# Patient Record
Sex: Female | Born: 1935 | Race: White | Hispanic: No | Marital: Married | Smoking: Never smoker
Health system: Southern US, Community
[De-identification: ages and names within clinical notes are randomized; demographics above are authoritative.]

## PROBLEM LIST (undated history)

## (undated) DIAGNOSIS — C7931 Secondary malignant neoplasm of brain: Secondary | ICD-10-CM

## (undated) DIAGNOSIS — I209 Angina pectoris, unspecified: Secondary | ICD-10-CM

## (undated) DIAGNOSIS — G8929 Other chronic pain: Secondary | ICD-10-CM

## (undated) DIAGNOSIS — R002 Palpitations: Secondary | ICD-10-CM

## (undated) DIAGNOSIS — I259 Chronic ischemic heart disease, unspecified: Secondary | ICD-10-CM

## (undated) DIAGNOSIS — I10 Essential (primary) hypertension: Secondary | ICD-10-CM

## (undated) DIAGNOSIS — M549 Dorsalgia, unspecified: Secondary | ICD-10-CM

## (undated) HISTORY — PX: TOTAL KNEE ARTHROPLASTY: SHX125

## (undated) HISTORY — DX: Other chronic pain: G89.29

## (undated) HISTORY — PX: TONSILLECTOMY AND ADENOIDECTOMY: SHX28

## (undated) HISTORY — DX: Dorsalgia, unspecified: M54.9

## (undated) HISTORY — DX: Palpitations: R00.2

---

## 1969-06-28 HISTORY — PX: PARTIAL HYSTERECTOMY: SHX80

## 1998-04-16 ENCOUNTER — Ambulatory Visit (HOSPITAL_BASED_OUTPATIENT_CLINIC_OR_DEPARTMENT_OTHER): Admission: RE | Admit: 1998-04-16 | Discharge: 1998-04-16 | Payer: Self-pay | Admitting: *Deleted

## 1999-10-06 ENCOUNTER — Encounter: Admission: RE | Admit: 1999-10-06 | Discharge: 1999-10-06 | Payer: Self-pay | Admitting: Family Medicine

## 1999-10-06 ENCOUNTER — Encounter: Payer: Self-pay | Admitting: Family Medicine

## 1999-10-14 ENCOUNTER — Encounter: Payer: Self-pay | Admitting: Family Medicine

## 1999-10-14 ENCOUNTER — Encounter: Admission: RE | Admit: 1999-10-14 | Discharge: 1999-10-14 | Payer: Self-pay | Admitting: Family Medicine

## 2000-10-14 ENCOUNTER — Encounter: Payer: Self-pay | Admitting: Family Medicine

## 2000-10-14 ENCOUNTER — Encounter: Admission: RE | Admit: 2000-10-14 | Discharge: 2000-10-14 | Payer: Self-pay | Admitting: Family Medicine

## 2001-10-18 ENCOUNTER — Encounter: Payer: Self-pay | Admitting: Family Medicine

## 2001-10-18 ENCOUNTER — Encounter: Admission: RE | Admit: 2001-10-18 | Discharge: 2001-10-18 | Payer: Self-pay | Admitting: Family Medicine

## 2002-04-10 ENCOUNTER — Encounter: Admission: RE | Admit: 2002-04-10 | Discharge: 2002-04-10 | Payer: Self-pay | Admitting: Family Medicine

## 2002-04-10 ENCOUNTER — Encounter: Payer: Self-pay | Admitting: Family Medicine

## 2002-06-14 ENCOUNTER — Encounter (INDEPENDENT_AMBULATORY_CARE_PROVIDER_SITE_OTHER): Payer: Self-pay | Admitting: *Deleted

## 2002-06-14 ENCOUNTER — Ambulatory Visit (HOSPITAL_COMMUNITY): Admission: RE | Admit: 2002-06-14 | Discharge: 2002-06-14 | Payer: Self-pay | Admitting: Gastroenterology

## 2002-06-28 HISTORY — PX: CORONARY ANGIOPLASTY WITH STENT PLACEMENT: SHX49

## 2002-09-26 ENCOUNTER — Ambulatory Visit (HOSPITAL_COMMUNITY): Admission: RE | Admit: 2002-09-26 | Discharge: 2002-09-27 | Payer: Self-pay | Admitting: Cardiovascular Disease

## 2003-01-18 ENCOUNTER — Encounter: Payer: Self-pay | Admitting: Family Medicine

## 2003-01-18 ENCOUNTER — Encounter: Admission: RE | Admit: 2003-01-18 | Discharge: 2003-01-18 | Payer: Self-pay | Admitting: Family Medicine

## 2003-05-08 ENCOUNTER — Ambulatory Visit (HOSPITAL_COMMUNITY): Admission: RE | Admit: 2003-05-08 | Discharge: 2003-05-08 | Payer: Self-pay | Admitting: Gastroenterology

## 2004-02-04 ENCOUNTER — Encounter: Admission: RE | Admit: 2004-02-04 | Discharge: 2004-02-04 | Payer: Self-pay | Admitting: Family Medicine

## 2004-12-24 ENCOUNTER — Ambulatory Visit (HOSPITAL_COMMUNITY): Admission: RE | Admit: 2004-12-24 | Discharge: 2004-12-24 | Payer: Self-pay | Admitting: Urology

## 2005-02-08 ENCOUNTER — Encounter: Admission: RE | Admit: 2005-02-08 | Discharge: 2005-02-08 | Payer: Self-pay | Admitting: Cardiology

## 2005-06-22 ENCOUNTER — Encounter: Admission: RE | Admit: 2005-06-22 | Discharge: 2005-06-22 | Payer: Self-pay | Admitting: Orthopaedic Surgery

## 2005-07-06 ENCOUNTER — Encounter: Admission: RE | Admit: 2005-07-06 | Discharge: 2005-07-06 | Payer: Self-pay | Admitting: Orthopaedic Surgery

## 2005-07-20 ENCOUNTER — Encounter: Admission: RE | Admit: 2005-07-20 | Discharge: 2005-07-20 | Payer: Self-pay | Admitting: Orthopaedic Surgery

## 2006-02-22 ENCOUNTER — Encounter: Admission: RE | Admit: 2006-02-22 | Discharge: 2006-02-22 | Payer: Self-pay | Admitting: Family Medicine

## 2006-03-28 ENCOUNTER — Inpatient Hospital Stay (HOSPITAL_COMMUNITY): Admission: RE | Admit: 2006-03-28 | Discharge: 2006-03-30 | Payer: Self-pay | Admitting: Orthopaedic Surgery

## 2006-03-28 HISTORY — PX: ANTERIOR CERVICAL DISCECTOMY: SHX1160

## 2006-04-01 ENCOUNTER — Inpatient Hospital Stay (HOSPITAL_COMMUNITY): Admission: EM | Admit: 2006-04-01 | Discharge: 2006-04-06 | Payer: Self-pay | Admitting: Emergency Medicine

## 2007-01-13 ENCOUNTER — Inpatient Hospital Stay (HOSPITAL_COMMUNITY): Admission: RE | Admit: 2007-01-13 | Discharge: 2007-01-17 | Payer: Self-pay | Admitting: Orthopaedic Surgery

## 2007-03-31 ENCOUNTER — Encounter: Admission: RE | Admit: 2007-03-31 | Discharge: 2007-03-31 | Payer: Self-pay | Admitting: Cardiology

## 2007-06-03 IMAGING — CR DG CHEST 2V
2 series · 2 of 2 positions shown · non-contrast
Comparison: None.

CLINICAL DATA: Pre-op respiratory exam for presumed bladder surgery.  Stress urinary continence.  
 PA AND LATERAL CHEST:

[view not recorded (1 of 2)]
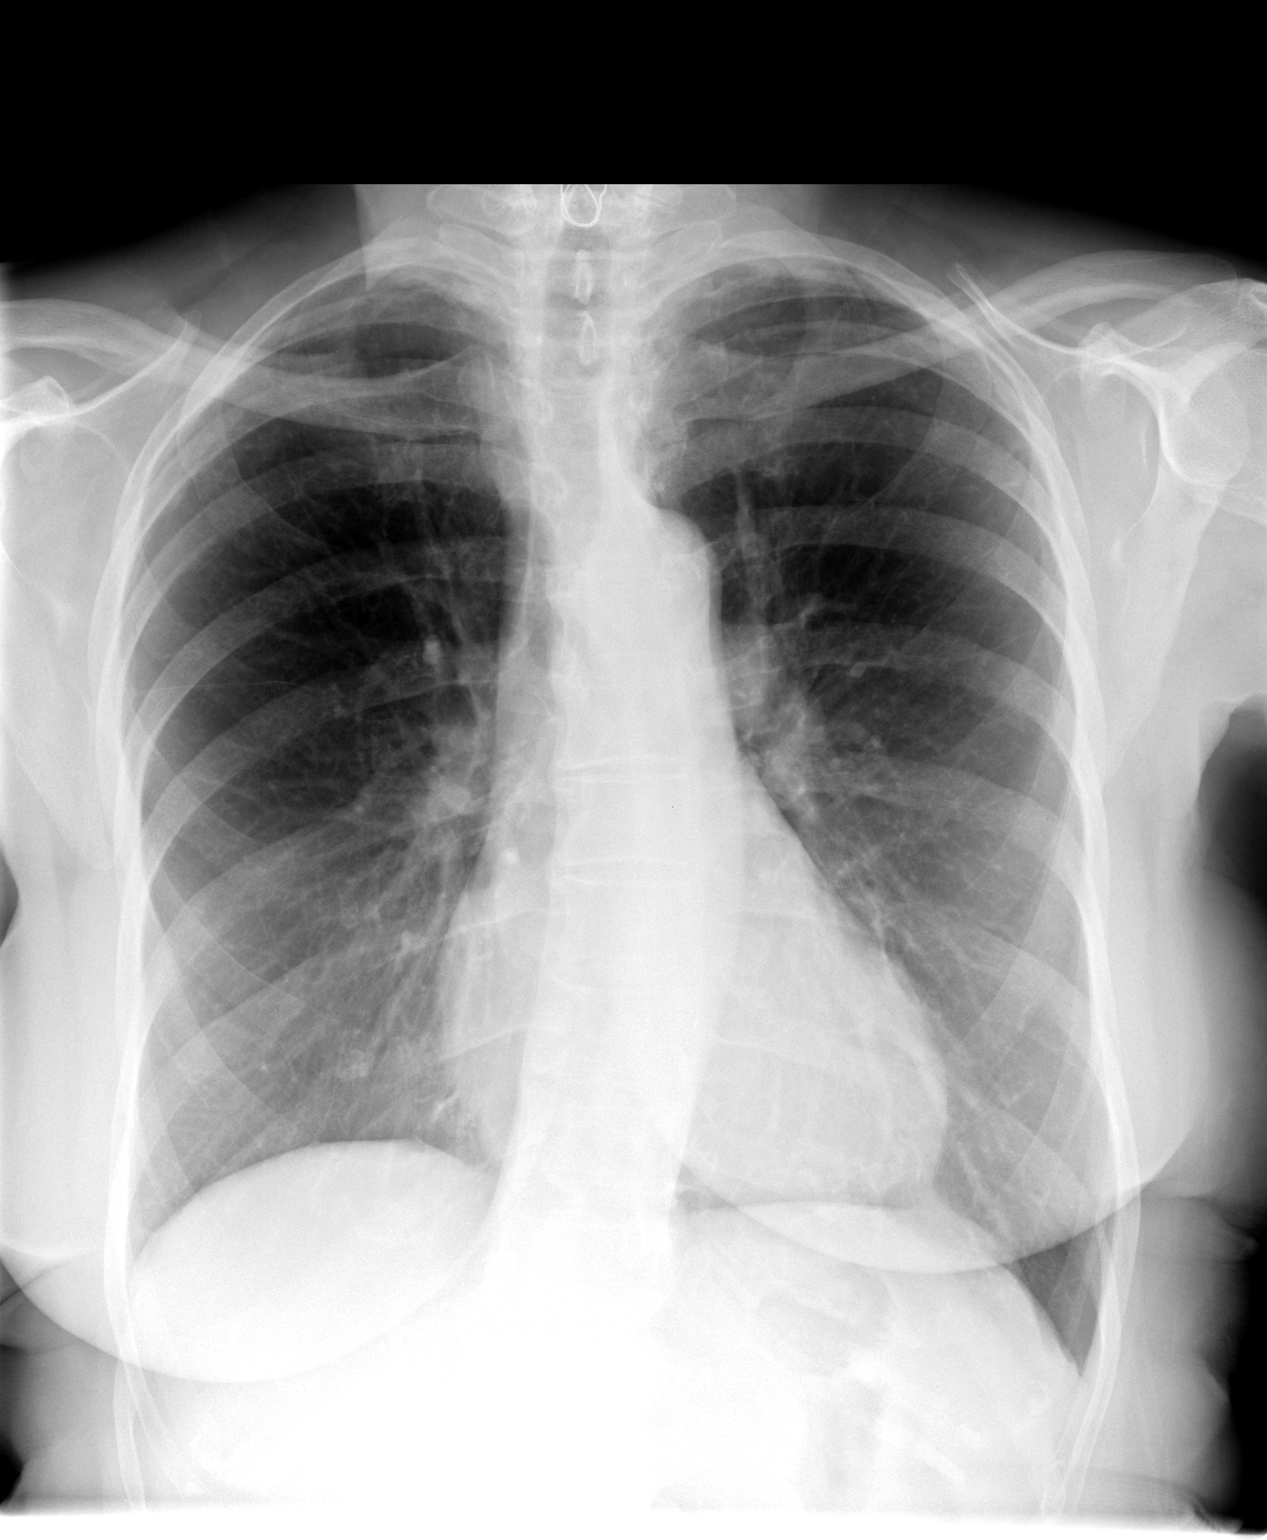

[view not recorded (2 of 2)]
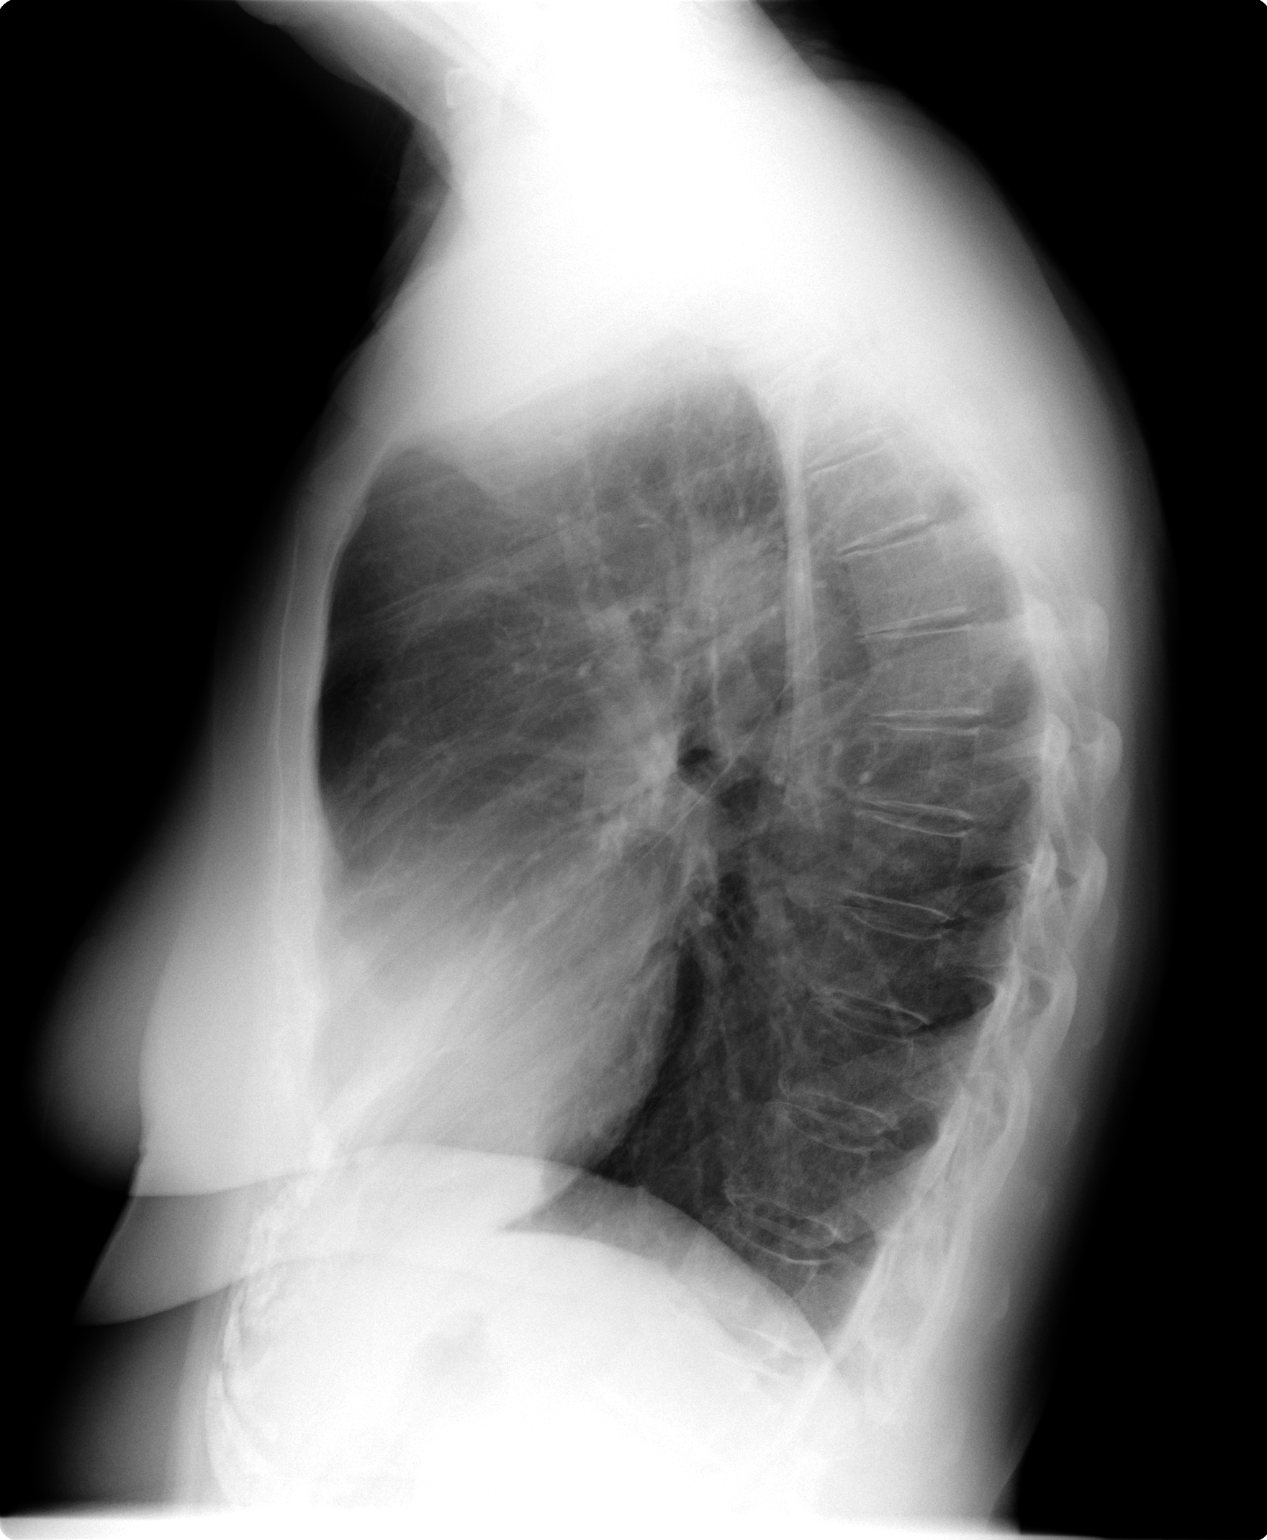

[2 of 2 positions shown; findings below may reference images not displayed]

There is a mild thoracolumbar scoliosis.  The cardiomediastinal contours are normal.  The lungs are hyperinflated but clear.  The patient is status-post lower cervical fusion.
IMPRESSION: Mild pulmonary hyperinflation.  No acute chest findings.

## 2007-09-13 HISTORY — PX: CARDIOVASCULAR STRESS TEST: SHX262

## 2007-09-22 ENCOUNTER — Inpatient Hospital Stay (HOSPITAL_COMMUNITY): Admission: RE | Admit: 2007-09-22 | Discharge: 2007-09-25 | Payer: Self-pay | Admitting: Orthopaedic Surgery

## 2007-09-22 ENCOUNTER — Encounter (INDEPENDENT_AMBULATORY_CARE_PROVIDER_SITE_OTHER): Payer: Self-pay | Admitting: Orthopaedic Surgery

## 2008-04-30 ENCOUNTER — Encounter: Admission: RE | Admit: 2008-04-30 | Discharge: 2008-04-30 | Payer: Self-pay | Admitting: Cardiology

## 2008-10-28 ENCOUNTER — Ambulatory Visit (HOSPITAL_COMMUNITY): Admission: RE | Admit: 2008-10-28 | Discharge: 2008-10-29 | Payer: Self-pay | Admitting: Orthopaedic Surgery

## 2010-02-24 ENCOUNTER — Ambulatory Visit: Payer: Self-pay | Admitting: Cardiology

## 2010-07-17 ENCOUNTER — Ambulatory Visit: Payer: Self-pay | Admitting: Cardiology

## 2010-10-07 LAB — BASIC METABOLIC PANEL
BUN: 12 mg/dL (ref 6–23)
CO2: 29 mEq/L (ref 19–32)
Calcium: 10 mg/dL (ref 8.4–10.5)
Creatinine, Ser: 0.7 mg/dL (ref 0.4–1.2)
GFR calc Af Amer: >60 mL/min (ref >60)
Glucose, Bld: 97 mg/dL (ref 70–99)

## 2010-10-07 LAB — CBC
MCHC: 34.4 g/dL (ref 30.0–36.0)
Platelets: 170 10*3/uL (ref 150–400)
RDW: 14.9 % (ref 11.5–15.5)

## 2010-11-10 NOTE — Op Note (Signed)
Susan Clark, Susan Clark NO.:  [id number]   MEDICAL RECORD NO.:  [id number]          PATIENT TYPE:  INP   LOCATION:  2899                         FACILITY:  [facility]   PHYSICIAN:  Mark C. Ophelia Charter, M.D.    DATE OF BIRTH:  June 12, 1929   DATE OF PROCEDURE:  10/28/2008  DATE OF DISCHARGE:                               OPERATIVE REPORT   PREOPERATIVE DIAGNOSIS:  L4-5 stenosis with degenerative  anterolisthesis.   POSTOPERATIVE DIAGNOSIS:  L4-5 stenosis with degenerative  anterolisthesis.   PROCEDURE:  L4-5 decompression.   SURGEON:  Mark C. Ophelia Charter, MD   ASSISTANT:  Maud Deed, PA-C   ANESTHESIA:  GOT.   ESTIMATED BLOOD LOSS:  200 mL.   DRAINS:  None.   This 77 year old female has had progressive increased L4-5 stenosis over  the years with neurogenic claudication symptoms, broad-based disk  protrusion at 4-5 and shifting on lateral flexion/extension x-rays.  She  does have some retrolisthesis at upper lumbar levels and having  neurogenic claudication symptoms from her severe stenosis at 4-5.   After induction of general anesthesia and orotracheal intubation, the  patient placed prone with preoperative Ancef prophylaxis, standard prep,  and DuraPrep.  Area was squared with towels, Betadine Vi-Drape  application and laminectomy sheets and drapes.  Needle localization was  placed and needle was just above the L4-5 level at the level of the L4  pedicle.  Incision was started, the needle extended caudad and Weitlaner  retractor was placed.  Spinous process was identified and ligament was  peeled off right and left up.  Trocar was used for termination, but  there was motion at 5-1 and also at 4-5 was expected with narrow space  between the L4 and L5 lamina.  Spinous process was removed from L4 and  top half of L5 and lamina was thinned with rongeur as well as the bur.  Thick chunks of ligament were removed and another x-ray was taken prior  to removing spinous process  with Kochers above and below.  The level of  disk was a planned level of decompression.  After this, I performed the  spinous process __________ then with a bur thick chunks of ligament were  removed.  Patty was used to protect the dura.  At the top portion of the  L5 lamina and in the right gutter, there was adherence of the dura with  scar tissue.  The operative microscope was being used and  microdissection techniques were used to try to release and peel this  off.  It was adherent and a 1-mm dural tear occurred, which was dorsal  and right side.  Small patty was placed and a 6-mm Prolene was used in  the tip of the needle and the hole was about the same size.  A single 6-  0 Prolene suture was placed, tied, and then few drops of Tisseel over  the top.  The leak was dry and continued dissection moving chunks of the  ligament, right and left gutters.  Once the dura was free, another x-ray  was taken with a Penfield 4 down  at the base and this was just above the  level of the disk.  There was overhanging spurs at the level and bone  was removed inferiorly removing bone from the foramina to free up the  nerve root both right and left.  X-ray was taken with a Penfield 4 just  below the level of the disk at the top of the pedicle and confirmed that  we have been all the way down to that level 4 decompression.  After  irrigation with saline solution, Durapore was checked.  It was  watertight.  Wound was irrigated.  0 Vicryl was placed in the fascia,  interrupted sutures, 2-0 Vicryl subcutaneous tissue subcuticular skin  closure.  Tincture of Benzoin, Steri-Strips, postop dressing and then  transferred to recovery room in stable condition.  Instrument count and  needle count was correct.  Hockey stick was placed from the right and  left side at the level of this and swept anterior to the dura, and  although there were large number of epidural veins, Gelfoam was then  placed in each gutter, and  there were no fragments anterior to the dura.  Gelfoam was removed prior to closure.  The patient tolerated the  procedure well and was transferred to recovery room in stable condition.      Mark C. Ophelia Charter, M.D.  Electronically Signed     MCY/MEDQ  D:  10/28/2008  T:  10/29/2008  Job:  [id number]

## 2010-11-10 NOTE — Op Note (Signed)
NAME:  Susan Clark, Susan Clark NO.:  [id number]   MEDICAL RECORD NO.:  [id number]          PATIENT TYPE:  INP   LOCATION:  2550                         FACILITY:  [facility]   PHYSICIAN:  Mark C. Ophelia Charter, M.D.    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   PROCEDURE:  Right knee revision with exchange of meniscal bearings and  patellar polyethylene.   SURGEON:  Annell Greening, M.D.   ANESTHESIA:  GOT plus preoperative femoral block.   PROCEDURE:  After induction of general anesthesia and orotracheal  intubation, standard prepping and draping, sterile skin marker on the  old incision and Betadine Vicryl-Drape application, time-out procedure  check list was completed.  Leg was wrapped in Esmarch, tourniquet  inflated.  Old incision was opened.  Superficial retinaculum was peeled  back, and the old medial parapatellar incision was visualized with line  of nonabsorbable sutures.  A 15 scalpel blade was used to divide through  the old arthrotomy dividing the sutures.  This was medial one-third,  lateral two-thirds quad tendon incision.  There was some fluid present  and synovitis present, mostly adjacent to the patella with brownish  synovium.  The edge of the patellar component showed significant wear,  and flap piece was pushed up.  It was firmly attached to the patella.  This was a snap-on two-piece patella.  Bearing wear was mild to moderate  after 14 years, and soft tissue was debrided until both meniscal  bearings were fully visualized.  Patella was flipped over, knee was  flexed.  Narrow Rogelia Rohrer was placed posteriorly next to the PCL, and  lateral bearing was pulled slightly forward.  Additional synovium was  debrided, and the knee was brought out in extension, and the lateral  bearing was popped out with a right-angle clamp, pulling out of the  runner.  A trial pin was placed, and then effort was worked on the  medial bearing which was more difficult to remove, since the patient was  slightly tighter medial than lateral.  With the knee in flexion and a  McHale retractor placed, meniscal bearing was popped out.  A 12.5 trial  was attempted, but it was too tight, and a 10 trial fit nicely, and a  permanent new polyethylene pin was placed for the medial bearing, and  then the lateral trial was removed, and a 10-mm lateral bearing was  placed.  There was full extension, full flexion, bearings tracked well.  Pulsatile lavage was used, repeat irrigation, and then tourniquet  deflation and hemostasis obtained and then layered closure, standard,  with nonabsorbable and 2-0 in the subcutaneous tissue.  The patient had  very thin skin.  Skin staple closure was used in the skin.  Postoperative dressing, knee immobilizer, and transferred to the  recovery room in stable condition.  Instrument count and needle count  were  correct.      Mark C. Ophelia Charter, M.D.  Electronically Signed    MCY/MEDQ  D:  09/22/2007  T:  09/23/2007  Job:  [id number]

## 2010-11-10 NOTE — Op Note (Signed)
NAME:  Susan Clark, Susan Clark              ACCOUNT NO.:  [id number]   MEDICAL RECORD NO.:  [id number]          PATIENT TYPE:  INP   LOCATION:  2550                         FACILITY:  [facility]   PHYSICIAN:  Mark C. Ophelia Charter, M.D.    DATE OF BIRTH:  09-Nov-1928   DATE OF PROCEDURE:  01/13/2007  DATE OF DISCHARGE:                               OPERATIVE REPORT   PREOPERATIVE DIAGNOSIS:  Left knee osteoarthritis with valgus deformity.   POSTOPERATIVE DIAGNOSIS:  Left knee osteoarthritis with valgus  deformity.   PROCEDURE:  Left cemented total knee arthroplasty computer assisted.   SURGEON:  Mark C. Ophelia Charter, M.D.   ANESTHESIA:  General plus 30 mL local.   ASSISTANT:  Patrick Jupiter, P.A.-C.   TOURNIQUET TIME:  63 minutes.   COMPONENTS USED:  DePuy PFC Sigma cruciate substituting 2.5 size femoral  component with lugs, 2.5 tibial tray, cemented, all poly patella, and 10  mm 2.5 insert, all components cemented, Smart set, HV 40 gram cement.   PROCEDURE IN DETAIL:  After induction of general anesthesia and  orotracheal intubation, preoperative femoral nerve block, preoperative  Ancef prophylaxis, standard prep and drape, proximal thigh tourniquet,  heel bump, and lateral post was used.  Standard total knee sheets,  drapes, impervious stockinette, Coban split sheets, drapes, sterile skin  marker, Betadine Vidrape was used to seal the skin.  The leg was wrapped  in an Esmarch and the tourniquet inflated.   A midline incision was made, superficial retinaculum was divided, medial  parapatellar incision was made splitting the quad tendon between the  medial 1/3 and the lateral 2/3, patella flipped over.  Spurs were  removed and menisci were resected.  There was lateral compartment bone-  on-bone changes with valgus deformity and a 10 degree knee flexion  fracture.  Computer pins were placed bicortical.  Once in the tibia  through a stab incision with 15 blade later closed at the end of the  case with  tincture of Benzoin and Steri-Strips.  Initiation of the  computer was performed, femoral and tibial models were made.  10.2 mm  taken off the femur and cuts were verified.  The chamfer cuts and box  cuts were made.  9 mm resected off the tibia and more bone was removed  medial than lateral, correcting the valgus deformity due to excessive  lateral compartment wear.  A little bit of bone was still more prominent  posteriorly and a little bit of bone was removed posteriorly on the  tibia which corrected the slope back to neutral.  Flexion/extension gaps  were good. Posterior spurs were removed off the femur 3/4 straight  osteotome.  Varus/valgus balancing was less than 0.5 mm.  This was  confirmed in both flexion and extension.  Trials were placed, inserted,  excellent full extension, good stability, flexion/extension.  After  pulsatile lavage, the patella was prepared with the 35 mm all poly  removing 0.5 mm of bone.  The tibia was cemented first followed by  femur, placement of the permanent tray and then patella clamped.  All  excess cement was removed and after  14 minutes, the cement was hard.  The tourniquet was deflated and hemostasis obtained with Bovie  electrocautery.  Sequential closure of the layers with nonabsorbable #1  Ethibond in the quad tendon and true retinaculum, closing the deep layer  anatomically.  Superficial retinaculum closed with 2-0, 2-0 on the subcutaneous tissue,  4-0 Vicryl subcuticular.  Tincture of Benzoin and Steri-Strips. Marcaine  infiltration, some in the joint and some in the skin.  Postop dressing,  knee immobilizer.  Instrument count and needle counts were correct.      Mark C. Ophelia Charter, M.D.  Electronically Signed     MCY/MEDQ  D:  01/13/2007  T:  01/13/2007  Job:  [id number]

## 2010-11-13 NOTE — Op Note (Signed)
   NAME:  Susan Clark, Susan Clark                        ACCOUNT NO.:  [id number]   MEDICAL RECORD NO.:  [id number]                   PATIENT TYPE:  AMB   LOCATION:  ENDO                                 FACILITY:  [facility]   PHYSICIAN:  Anselmo Rod, M.D.               DATE OF BIRTH:  September 12, 1928   DATE OF PROCEDURE:  05/08/2003  DATE OF DISCHARGE:                                 OPERATIVE REPORT   PROCEDURE:  Esophagogastroduodenoscopy.   ENDOSCOPIST:  Anselmo Rod, M.D.   INSTRUMENT USED:  Olympus video panendoscope.   INDICATIONS FOR PROCEDURE:  Abnormal weight loss, iron deficiency anemia in  a 77 year old white female. Rule out peptic ulcer disease, esophagitis,  gastritis, etc.   PREPROCEDURE PREPARATION:  Informed consent was procured from the patient.  The patient fasted for eight hours prior to the procedure.   PREPROCEDURE PHYSICAL:  The patient had stable vital signs. Neck supple.  Chest clear to auscultation. Abdomen soft with normal bowel sounds.   DESCRIPTION OF PROCEDURE:  The patient was placed in the left lateral  decubitus position and sedated with 50 mg of Demerol and 5 mg of Versed  intravenously. Once the patient was adequately sedated and maintained on low  flow oxygen and continuous cardiac monitoring, the Olympus video  panendoscope was advanced with the mouthpiece over the tongue into the  esophagus under direct vision. The entire esophagus appeared normal with no  evidence of ring, stricture, masses, esophagitis or Barrett's mucosa. The  scope was then advanced to the stomach. A small hiatal hernia was seen in  high retroflexion, the rest of the gastric mucosa in the proximal small  bowel appeared normal.   IMPRESSION:  Normal esophagogastroduodenoscopy except for a small hiatal  hernia.   RECOMMENDATIONS:  Proceed with a colonoscopy at a time. Further  recommendations made thereafter.                                               Anselmo Rod,  M.D.    JNM/MEDQ  D:  05/08/2003  T:  05/08/2003  Job:  [id number]   cc:   Marjory Lies, M.D.  [address]  [place]  [place] [address]  Fax: [phone]

## 2010-11-13 NOTE — Op Note (Signed)
NAME:  AYLEN, STRADFORD                        ACCOUNT NO.:  [id number]   MEDICAL RECORD NO.:  [id number]                   PATIENT TYPE:  AMB   LOCATION:  ENDO                                 FACILITY:  [facility]   PHYSICIAN:  Anselmo Rod, M.D.               DATE OF BIRTH:  Sep 11, 1928   DATE OF PROCEDURE:  06/14/2002  DATE OF DISCHARGE:  06/14/2002                                 OPERATIVE REPORT   PROCEDURE:  Colonoscopy with snare polypectomy x1.   ENDOSCOPIST:  Anselmo Rod, M.D.   INSTRUMENT USED:  Olympus video colonoscope.   INDICATIONS FOR PROCEDURE:  A 77 year old white female with a history of  rectal bleeding.  Rule out colonic polyps, masses, etc.  An informed consent was procured from the patient.  The patient was fasted  for eight hours prior to the procedure and prepped with a bottle of  magnesium citrate and NuLytely on the night prior to the procedure.   PRE-PROCEDURE PHYSICAL EXAMINATION:  VITAL SIGNS:  The patient with stable  vital signs.  NECK:  Supple.  CHEST:  Clear to auscultation.  ABDOMEN:  Soft with normal bowel sounds.   DESCRIPTION OF PROCEDURE:  The patient was placed in the left lateral  decubitus position and sedated with 50mg  of Demerol and 5 mg of Versed  intravenously.  Once the patient was adequately sedated and maintained on  low-flow oxygen and continuous cardiac monitoring, the Olympus video  colonoscope was advanced from the rectum to the cecum with difficulty.  There was a large amount of solid stool in the right colon, including the  cecum.  The appendicular orifice and the ileocecal valve were visualized.  A  5.0 to 6.0 mm sessile polyp was snared at 25.0 cm.  Prominent internal  hemorrhoids were seen on retroflexion.  The rest of the examination was  within normal limits.  Visualization of the right colon was somewhat  limited, secondary to a poor prep.   IMPRESSION:  1. Prominent internal hemorrhoids.  2. A 5.0 to 6.0 mm  sessile poly snared from 25.0 cm.  3. Normal-appearing colon.  4. Limited examination of the right colon, including the cecum.   RECOMMENDATIONS:  1. Await pathology results.  2.     A high fiber diet.  3. Avoid all nonsteroidals including aspirin for now.  4. Outpatient followup within the next two weeks with further     recommendations.                                                 Anselmo Rod, M.D.    JNM/MEDQ  D:  06/15/2002  T:  06/17/2002  Job:  [id number]   cc:   Marjory Lies, M.D.  [address]  [place]  [place] [address]  Fax: [phone]

## 2010-11-13 NOTE — Op Note (Signed)
NAME:  Susan Clark, SAND NO.:  [id number]   MEDICAL RECORD NO.:  [id number]          PATIENT TYPE:  INP   LOCATION:  5024                         FACILITY:  [facility]   PHYSICIAN:  Mark C. Ophelia Charter, M.D.    DATE OF BIRTH:  [birth year]   DATE OF PROCEDURE:  03/28/2006  DATE OF DISCHARGE:                                 OPERATIVE REPORT   PREOPERATIVE DIAGNOSIS:  C4-5 spondylosis with herniated nucleus pulposus.   POSTOPERATIVE DIAGNOSIS:  C4-5 spondylosis with herniated nucleus pulposus.   PROCEDURE:  C4-5 anterior cervical discectomy and fusion, left iliac bone  graft.   SURGEON:  Annell Greening, MD   ASSISTANT:  Velna Hatchet Patient, PA-C   ANESTHESIA:  GOT plus Marcaine skin local.   DRAINS:  One Hemovac, neck.   PROCEDURE:  After standard prep with drape and preoperative antibiotics,  time out procedure, the neck and iliac crest was prepped, squared with  towels, sterile skin marker on the neck, Betadine bio-drape and application.  Sterile Mayo stand at the head, thyroid sheets and drapes.  Incision was  made starting at the midline and extending to the left.  Blunt dissection  below the platysma, which had been split in line with the skin incision.  Long-handled Cloward retractors were placed right and left.  Incision was  made in the annulus of the  disc after cross-table lateral x-ray confirmed  that the needle was at C5-6, the level above was 4-5, the operative level  and this was marked by incising the anterior annulus of the disc and  removing some of the disc, leaving a hole for appropriate level.  Self-  retaining Cloward retractors were placed, teeth placed right and left,  underneath the longus Colles, smooth blades up and down to protect the  esophagus.  Discectomy was performed.  Uncovertebral joints were stripped  with Cloward curettes sequentially.  Karlin curettes were used as well.  The  operative microscope was draped and brought in and after drilling  the  Cloward 12 mm hole back to the posterior cortex, the operative microscope  was used to take down the posterior longitudinal ligament.  There was over  hanging spurs with the absence of a posterior disk space from the overhang.  The overhang from the level above was removed with microdissection, 1 mm  Kerrison's were used to remove the spurs off the uncovertebral joint,  enlarging the opening for the foramina.  Posterior longitudinal ligament was  taken down and some remaining epidural veins were left.  There was complete  exposure of the dura and the dura was decompressed.  A small ledge of bone  was left on the inferior aspect of the hole to prevent posterior plug  migration.  A 14 mm plug was harvested from the left iliac crest, splitting  the fascia in line with the fibers to minimize postoperative pain.  Subperiosteal dissection with a Cobb was performed.  Plug was harvested and  impacted, counter sunk 2 mm based on depth gauge measurements.  After  irrigation with saline solution, a trocar Hemovac was used with an in and  out technique in line with the skin incision.  The platysma closed with 3-0.  There was egress fluid from epidural vein around the edge of the plug after  distraction.  This subsided within a few minutes.  There were some epidural  veins out on the corner that did not require treatment.  The platysma was  closed  with 3-0, 4-0 Vicryl subcuticular closure.  Tincture of Benzoin and Steri-  Strips.  Marcaine infiltration in the skin.  Postop dressing.  The iliac  crest was closed with 0 in the fascia, 2-0 Vicryl, and a 4-0 subcuticular  closure.  Postop dressing was applied.  The patient was transferred to the  recovery room in stable condition.      Mark C. Ophelia Charter, M.D.  Electronically Signed     MCY/MEDQ  D:  03/28/2006  T:  03/29/2006  Job:  [id number]

## 2010-11-13 NOTE — Discharge Summary (Signed)
NAME:  Susan Clark, Susan Clark              ACCOUNT NO.:  [id number]   MEDICAL RECORD NO.:  [id number]          PATIENT TYPE:  INP   LOCATION:  3735                         FACILITY:  [facility]   PHYSICIAN:  Mobolaji B. Bakare, M.D.DATE OF BIRTH:  17-Sep-1928   DATE OF ADMISSION:  04/01/2006  DATE OF DISCHARGE:                                 DISCHARGE SUMMARY   PRIMARY CARE PHYSICIAN:  Marjory Lies, M.D.   ORTHOPEDIC SURGEON:  Mark C. Ophelia Charter, M.D.   DATE OF ADMISSION:  April 01, 2006.   EXPECTED DATE OF DISCHARGE:  April 05, 2006.   FINAL DIAGNOSES:  1. Syncope, likely vasovagal.  2. Hyponatremia.  3. Macrocytic anemia.  4. Hematoma left iliac crest surgical site.  5. Dysphagia secondary to pharyngeal pain.  6. Leukocytosis, likely reactive.   SECONDARY DIAGNOSES:  1. Recent cervical surgery with C4-C5 anterior cervical diskectomy and      fusion with left leg bone graft done by Dr. Ophelia Charter on the 5th of      October, 2007.  2. History of coronary artery disease.  3. History of macrocytic anemia status post colonoscopy in 2004, by Dr.      Loreta Ave.   BRIEF HISTORY:  Ms. Oetken is a pleasant 77 year old Caucasian female who  was admitted on the 5th of October, 2007, after having passed out while  being bathed on the day of admission.  She had recently undergone cervical  surgery, cervical diskectomy of the 1st of October, 2007, and was discharged  home on the 3rd of October, 2007.  The patient was fairly getting along.  She was very weak two days prior to hospitalization.  She was very weak and  had not been eating because of pain in her throat, which has been ongoing  since post intubation.  She was being given a sponge bath by a sister when  she suddenly went blank and unresponsive for a brief period, less than one  minute.  There was no convulsion.  The patient became more lucid.  She  denied chest pain, shortness of breath.  There was no diaphoresis.  The  sister called EMS and she  was brought to the emergency room.  On arrival,  blood pressure was 161/75, heart rate of 60, and a respiratory rate of 20,  O2 sat of 100%, temperature of 97.8.  Physical examination was unremarkable,  except for surgical incision sites noted in the anterior neck and left iliac  region.  There was a bruise around the left iliac surgical site.  Mentally,  the patient was awake and oriented x3.  Initial laboratory data revealed a  sodium of 116, potassium of 2.9, and chloride of 76.  BUN and creatinine  were normal.  She had a hemoglobin of 8.9, and platelets of 232.  MCV was  61.  She was then admitted to telemetry floor.   CONSULTATIONS:  Orthopedic consult, Mark C. Ophelia Charter, M.D.   PROCEDURES:  Chest x-ray showed apparent inflation, no acute cardiopulmonary  disease.  A CT scan done on the 6th of October, showed no definite evidence  of acute  intracranial abnormality, subtle low density in the internal  capsule suspicious for age-undetermined ischemia.  Following evaluation  showed mild pharyngeal dysphagia characterized by edema of posterior  pharyngeal wall, which prohibits epiglottis from fully inflating.   HOSPITAL COURSE:  1. Syncope.  The patient was admitted to telemetry and there was no      abnormal reading.  She had normal sinus rhythm.  She ruled out for      myocardial infarction.  A 2-D echocardiogram has been ordered and      results are pending at this time.  The patient has had significant      discomfort since surgery particularly with her throat.  She was found      to have had vasovagal syncope.  This has since resolved.  The patient      was able to ambulate with physical therapy, and she was felt to be      almost at baseline, however, home health physical therapy and      occupational therapy was recommended.  2. Hyponatremia.  The etiology of this felt to be multifactorial including      poor p.o. intake in addition to medication, hydrochlorothiazide.  The       patient was started on IV fluid, sodium improved.  At the time of      dictation, sodium is 130.  The patient remains asymptomatic.      Hydrochlorothiazide has been discontinued.  3. Hypokalemia.  This was repleted.  Potassium at time of dictation was      3.6.  4. Macrocytic anemia.  The patient has been previously worked up for      macrocytic anemia by Dr. Loreta Ave.  She had apparent difficulty and a      colonoscopy.  There was no clear etiology.  MCV is really low at 61.      Ferritin is normal.  Ferritin was 45, vitamin B12 of 1917, and folate      of 20.  TIBC was 362.  The patient did have hematoma around the      surgical site.  This may be responsible for the anemia.  We repeated a      stool Hemoccult which was negative.  The patient hemoglobin dropped      down to 7.9 while on IV fluid, from 8.9 on admission.  This was felt to      be more dilutional, nevertheless, she was transfused with 1 unit of      packed red blood cells.  At the time of dictation, hemoglobin was 9.9.      I would recommend a followup CBC in three to four weeks to assure      stability of hemoglobin.  She may need further evaluation on outpatient      basis for macrocytic anemia if anemia persists.  She will be discharged      on iron supplements.  5. Leukocytosis.  This was felt to be likely reactive.  The patient had      leukocytosis on discharge from the hospital postoperatively on the 3rd      of October.  This appeared to have increased to a level of 34,000 at      the time of readmission.  There was no obvious source of infection.  A      chest x-ray was clear.  Urinalysis was unremarkable.  The patient had      no GI symptoms.  Review of pathologic  smear done on the 2nd of October,      showed lymphocytosis and neutrophilia with mild left shift in      maturation, favoring reactive process.  Given the presence of hematoma      on the surgical site, the patient was prophylactically started on      antibiotic, Keflex 500 mg q.8 h. for which she is going to complete for      five days.  6. Dysphagia.  The patient was seen by speech therapy and had a swallowing      evaluation.  She was noted to have mild pharyngeal dysphagia secondary      to posterior pharyngeal wall edema.  The patient's physical examination      did not reveal any oral thrush, ulceration.  She had hyperemic      posterior pharyngeal wall which was felt to be secondary to intubation.      She was given chlorhexidine and throat gargle, and this has improved.      Appetite has improved and the patient is able to finish more than 60%      of her meals.   Other medical problems including CAD and hypertension remained stable during  the course of hospitalization.   Having held hydrochlorothiazide, blood pressure remained stable.  The  patient will follow up with Dr. Doristine Counter in outpatient setting to reintroduce  antihypertensive.   DISCHARGE MEDICATIONS:  1. Aspirin 81 mg daily.  2. Xanax p.r.n.  3. Darvocet one p.o. q.4 h. p.r.n.  4. Propranolol b.i.d.  5. Zocor 20 mg daily.  6. Ativan 1 mg q.h.s.  7. Keflex 500 mg t.i.d. to complete treatment on 12th of October, 2007.  8. Nu-Iron 150 mg daily.  9. Chlorhexidine throat spray p.r.n.   RECOMMENDATIONS:  1. Check CBC in three to four weeks.  2. Follow up with Dr. Loreta Ave for macrocytic anemia.   PERTINENT LABORATORY DATA:  Fecal Hemoccult negative.  Fasting lipid  profile:  Total cholesterol 59, triglycerides 97, HDL 57, LDL 83.  Iron 26,  TIBC 362, vitamin B12 of 1917, folate 19.9, ferritin 45.  Cortisol level was  normal.  TSH 1.645.  __________ 15.  Sodium 130, potassium 3.6, chloride 88,  bicarbonate 28, glucose 88, BUN 7, creatinine 0.7.  White cell 24.3,  hemoglobin 9.9, hematocrit 31.6, platelets 262.   An addendum to this dictation will be made with 2-D echocardiogram report  and discharge CBC.      Mobolaji B. Corky Downs, M.D.  Electronically  Signed     MBB/MEDQ  D:  04/04/2006  T:  04/05/2006  Job:  [id number]   cc:   Veverly Fells. Ophelia Charter, M.D.  Marjory Lies, M.D.

## 2010-11-13 NOTE — Discharge Summary (Signed)
   NAME:  Susan Clark, Susan Clark                        ACCOUNT NO.:  [id number]   MEDICAL RECORD NO.:  [id number]                   PATIENT TYPE:  OIB   LOCATION:  6531                                 FACILITY:  [facility]   PHYSICIAN:  Vesta Mixer, M.D.              DATE OF BIRTH:  [birth year]   DATE OF ADMISSION:  09/26/2002  DATE OF DISCHARGE:  09/27/2002                                 DISCHARGE SUMMARY   DISCHARGE DIAGNOSES:  1. Coronary artery disease, status post PTCA and stenting of the left     anterior descending artery.  2. Hypotension.  3. Hyperlipidemia.   DISCHARGE MEDICATIONS:  1. Enteric-coated aspirin 325 mg a day.  2. Plavix 75 mg a day.  3. Inderal LA 80 mg a day.  4. Norvasc 5 mg a day.  5. Nitroglycerin patches 0.2 mg a day.  6. Nitroglycerin tablets one sublingually as needed for chest pain.  7. Zocor 20 mg a day.   DISPOSITION:  The patient is to eat a low fat, low salt, and low cholesterol  diet. She is watch for any signs of bleeding. She is to see Dr. Patty Sermons in  one week.   HISTORY:  [name] is a 77 year old female who was admitted with some  chest tightness and an abnormal Cardiolite study revealing anteroapical  ischemia. Please see dictated H&P for further details.   HOSPITAL COURSE:  Chest pain. The patient has had a heart catheterization  which revealed a subtotal occlusion of her left anterior descending artery.  She underwent successful PTCA and stenting of the LAD using a 2.5 x 16-mm  TAXUS stent, inflated up to 40 atmospheres. She tolerated the procedure  quite well. She did have some compromise of a small to moderate size first  diagonal branch that she had no episodes of chest pain and no EKG changes.  She is discharged in satisfactory condition. She will be followed up by Dr.  Patty Sermons as noted above.                                               Vesta Mixer, M.D.    PJN/MEDQ  D:  09/27/2002  T:  09/28/2002  Job:  [id number]   cc:   Cassell Clement, M.D.  [address], [address]  [place]  [place] [address]  Fax: [phone]

## 2010-11-13 NOTE — Discharge Summary (Signed)
NAME:  Susan Clark, Susan Clark NO.:  [id number]   MEDICAL RECORD NO.:  [id number]          PATIENT TYPE:  INP   LOCATION:  5024                         FACILITY:  [facility]   PHYSICIAN:  Mark C. Ophelia Charter, M.D.    DATE OF BIRTH:  [birth year]   DATE OF ADMISSION:  01/13/2007  DATE OF DISCHARGE:  01/17/2007                               DISCHARGE SUMMARY   ADMISSION DIAGNOSES:  1. Left knee osteoarthritis with valgus deformity.  2. 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  3. Hypertension.  4. History of pneumonia.  5. Coronary artery disease status post stent placement.   DISCHARGE DIAGNOSES:  1. Left knee osteoarthritis with valgus deformity.  2. 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  3. Hypertension.  4. History of pneumonia.  5. Coronary artery disease status post stent placement.  6. Posthemorrhagic anemia requiring blood transfusion.  7. Hyponatremia.   PROCEDURE:  On January 13, 2007, the patient underwent left cemented total  knee arthroplasty, computer-assisted, performed by Dr. Ophelia Charter under  general anesthesia.   CONSULTATIONS:  None.   BRIEF HISTORY:  The patient is a 78 year old female with chronic  progressive left knee pain and dysfunction.  Radiographs have  demonstrated tricompartmental osteoarthritis of the left knee.  It was  felt she would require surgical intervention and admitted for the  procedure as stated above.   BRIEF HOSPITAL COURSE:  The patient tolerated the procedure under  general anesthesia without complications.  Postoperatively, she utilized  PCA analgesics for discomfort and was weaned to p.o. analgesics without  difficulty.  The patient was noted to have an elevated white blood cell  count postoperatively at 28.3.  She had given a history of having  previous increased white  blood cell count and low-grade fevers  postoperative from her previous procedures.  She also had known  hyponatremia which was noted to be present during this hospitalization  as well.  She did drop her hemoglobin to 7.4 with hematocrit 22.5 and  required blood transfusions of 2 units of packed red blood cells.  Hemoglobin and hematocrit returned to 9.2 and 28.2 after transfusion.  The patient received physical therapy for ambulation and gait training,  weightbearing as tolerated.  She was instructed in range of motion,  strengthening and stretching exercises of the right lower extremity.  CPM was utilized.  Dressing changes were done daily and no signs of  infection from the wound.  She was started on iron supplementation.  Coumadin was started for DVT prophylaxis with adjustments in dosage made  according to daily pro times by the pharmacy at [facility].  On January 17, 2007,  the patient was felt stable for discharge to her home with arrangements  made for home health physical therapy.   PERTINENT LABORATORY VALUES:  Hemoglobin and hematocrit as stated above.  White blood cell count on admission was 14 and at discharge 19.4, with  the highest value being 28.3 during the hospital stay.  Sodium dropped  to lowest value of 126.  One episode of hypokalemia at 3.4.  Urinalysis  on admission negative for urinary tract infection.   PLAN:  The patient was discharged home with arrangements for home health  physical therapy, occupational therapy and Coumadin management.  She  will continue with ambulation and gait training, weightbearing as  tolerated with a walker, range of motion, stretching and strengthening  exercises.  Dressing change as needed.  She will follow up with Dr.  Ophelia Charter 2 weeks from the date of surgery.   The patient will continue on home medications and was given  prescriptions for:  1. Tylox one to two every for 6 hours as needed for pain.  2. Coumadin 4 mg daily x3 weeks.  3.  Ferrous sulfate 325 mg one p.o. b.i.d. for 1 month.   She will resume all of her home medications as taken prior to admission  with the exception of aspirin and Centrum Silver.  The patient was  advised to call the office should she have questions or concerns prior  to her return office visit.   CONDITION ON DISCHARGE:  Stable.      Wende Neighbors, P.A.      Mark C. Ophelia Charter, M.D.  Electronically Signed    SMV/MEDQ  D:  02/10/2007  T:  02/11/2007  Job:  [id number]

## 2010-11-13 NOTE — Cardiovascular Report (Signed)
NAME:  Susan Clark, Susan Clark                        ACCOUNT NO.:  [id number]   MEDICAL RECORD NO.:  [id number]                   PATIENT TYPE:  OIB   LOCATION:  6526                                 FACILITY:  [facility]   PHYSICIAN:  Vesta Mixer, M.D.              DATE OF BIRTH:  [birth year]   DATE OF PROCEDURE:  09/26/2002  DATE OF DISCHARGE:                              CARDIAC CATHETERIZATION   INDICATIONS FOR PROCEDURE:  The patient is a 77 year old female with a  history of chest pain.  She recently had a stress Cardiolite study which  revealed anteroapical ischemia.  She is referred for heart catheterization  for further evaluation.   PROCEDURE:  Left heart catheterization with coronary angiography.   PROCEDURAL NOTE:  The right femoral artery was easily cannulated using the  modified Seldinger technique.   HEMODYNAMICS:  The left ventricular pressure was 131/19 with an aortic  pressure of 133/66.   ANGIOGRAPHY:  1. The left main coronary artery has minor luminal irregularities.  2. The left anterior descending artery starts off as a fairly normal vessel.     It is subtotally occluded at its mid point with only TIMI grade 1 flow.     Most of the flow down the distal LAD occurs via collaterals.  The first     diagonal vessel has moderate to severe disease with perhaps a 60% to 70%     stenosis at its origin.  This plaque is an extension of the same plaque     that is occluded in the LAD.  3. The left circumflex artery is a very large vessel.  There are minor     luminal irregularities.  4. The right coronary artery is a moderate-sized vessel.  There is a 20% to     30% stenosis in the proximal aspect of the vessel.  The remainder of the     RCA is unremarkable.  5. A left ventriculogram was performed in the 30-degree RAO position.  It     reveals overall normal left ventricular systolic function.  There are no     segmental wall motion abnormalities.  Ejection fraction is  approximately     65.   INTERVENTIONAL PROCEDURE:  The patient was given 3800 units of heparin  followed by a double bolus Integrilin drip.  The left main was engaged using  a 7-French Judkins left #4 guide.  A Trooper wire was used, but we could not  cross into the LAD.  A Cross-It 100 wire was eventually used to cross down  into the distal LAD.  A 2.5-mm Quantum Maverick would not cross the subtotal  occlusion.  We eventually used a 1.5-mm Maverick to cross down across the  lesion.  It was inflated twice, up to 8 atmospheres, for 30 seconds apiece.  This resulted in some improvement with good blood flow down into the distal  LAD.  We  then used the 2.5 x 15-mm Quantum Maverick across the stenosis.  It  was inflated up to 12 atmospheres for 30 seconds on 2 different occasions.  We then placed a 2.5 x 16-mm Taxus stent across the entire diseased segment.  It was inflated up to 14 atmospheres for 45 seconds.  This resulted in a  marked improvement of the LAD lesion but with some compromise of the first  diagonal vessel.  We then used the Quantum 2.5 x 15-mm balloon for post  dilatation.  It was inflated up to 18 atmospheres for 41 seconds, followed  by 18 atmospheres for 21 seconds, in the slightly more proximal segment.  This resulted in a very nice result of the LAD.  The first diagonal artery  was occluded following this injection.  The patient remained pain-free.  We  were able to cross out into the diagonal vessel with the wire but were not  able to cross the stent strut with any of the balloons and even a new 2.0-mm  Maverick.  The wire did reestablish antegrade flow down the diagonal vessel,  and the patient was stable and was pain-free upon leaving the lab.    IMPRESSION:  Successful percutaneous transluminal coronary angioplasty and  stenting of the mid left anterior descending artery.  There were no  complications.  She did have occlusion of a side branch that originally had   significant disease.  This is of no significant consequences.  The patient  was pain-free leaving the lab.                                               Vesta Mixer, M.D.    PJN/MEDQ  D:  09/26/2002  T:  09/26/2002  Job:  [id number]   cc:   Cassell Clement, M.D.  [address]  [place]  [place] [address]  Fax: [phone]

## 2010-11-13 NOTE — Op Note (Signed)
NAME:  Susan Clark, Susan Clark                        ACCOUNT NO.:  [id number]   MEDICAL RECORD NO.:  [id number]                   PATIENT TYPE:  AMB   LOCATION:  ENDO                                 FACILITY:  [facility]   PHYSICIAN:  Anselmo Rod, M.D.               DATE OF BIRTH:  1928/09/30   DATE OF PROCEDURE:  05/08/2003  DATE OF DISCHARGE:                                 OPERATIVE REPORT   PROCEDURE PERFORMED:  Colonoscopy.   ENDOSCOPIST:  Charna Elizabeth, M.D.   INSTRUMENT USED:  Olympus video colonoscope changed to a pediatric  adjustable scope.   INDICATIONS FOR PROCEDURE:  The patient is a 77 year old white female with a  history of abnormal weight loss and iron deficiency anemia.  Rule out  colonic polyps, masses, etc. The patient has had colonic polyps removed in  the past.   PREPROCEDURE PREPARATION:  Informed consent was procured from the patient.  The patient was fasted for eight hours prior to the procedure and prepped  with a bottle of magnesium citrate and a gallon of GoLYTELY the night prior  to the procedure.   PREPROCEDURE PHYSICAL:  The patient had stable vital signs.  Neck supple.  Chest clear to auscultation.  S1 and S2 regular.  Abdomen soft with normal  bowel sounds.   DESCRIPTION OF PROCEDURE:  The patient was placed in left lateral decubitus  position and sedated with an additional 20 mg of Demerol and 2 mg of Versed  intravenously.  Once the patient was adequately sedated and maintained on  low flow oxygen and continuous cardiac monitoring, the Olympus video  colonoscope was advanced from the rectum to the sigmoid colon with  difficulty.  There was significant spasm in the sigmoid colon.  Therefore,  the adult colonoscope was withdrawn and the pediatric adjustable colonoscope  was used instead.  Sigmoid diverticulosis was noted.  There was a large  amount of residual stool in the colon.  Small lesions could have been  missed.  No other masses or polyps were  seen.  The procedure was complete up  to the cecum.  The appendicular orifice and ileocecal valve were clearly  visualized and photographed.   IMPRESSION:  1. Unrevealing colonoscopy except for sigmoid diverticulosis.  2. Some residual stool in the colon, small lesions could have been missed.  3. No masses or polyps seen.    RECOMMENDATIONS:  1. Outpatient follow-up in the next two weeks for further recommendations.  2. Continue iron supplements for now.                                               Anselmo Rod, M.D.    JNM/MEDQ  D:  05/08/2003  T:  05/09/2003  Job:  [id number]   cc:  Marjory Lies, M.D.  [address]  [place]  [place] [address]  Fax: [phone]

## 2010-11-13 NOTE — Discharge Summary (Signed)
NAME:  Susan Clark, Susan Clark NO.:  [id number]   MEDICAL RECORD NO.:  [id number]          PATIENT TYPE:  INP   LOCATION:  5029                         FACILITY:  [facility]   PHYSICIAN:  Mark C. Ophelia Charter, M.D.    DATE OF BIRTH:  03/18/1929   DATE OF ADMISSION:  09/22/2007  DATE OF DISCHARGE:  09/25/2007                               DISCHARGE SUMMARY   ADMISSION DIAGNOSES:  1. 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  2. Hypertension.  3. Coronary artery disease status post stent in 2004.  4. Status post right total knee replacement in 1995.   DISCHARGE DIAGNOSES:  1. 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  2. Hypertension.  3. Coronary artery disease status post stent 2004.  4. Status post right total knee replacement in 1995.  5. Hypokalemia treated with supplementation orally.   PROCEDURE:  On September 22, 2007, the patient underwent exchange of poly  component with patellar polyethylene component.  No change in metal  components.  This was performed by Dr. Ophelia Charter, assisted by Maud Deed,  [facility] under general anesthesia.   CONSULTATIONS:  None.   BRIEF HISTORY:  The patient is a 77 year old female status post right  total knee arthroplasty in 1995.  She has had recurrent swelling and  pain of the right knee particularly with weightbearing.  Radiographs  have indicated poly wear on standing films of the right knee.  She is  not receiving relief with conservative treatment including analgesics  and wished to proceed with surgical intervention as stated above.   BRIEF HOSPITAL COURSE:  The patient tolerated the procedure under  general anesthesia without complications.  Postoperatively, she was  treated with IV analgesics and weaned to p.o. analgesics without  difficulty.  The patient was comfortable during the hospital stay.  Afebrile and vital signs were stable during the hospital stay.  Neurovascular and motor function in the lower extremities was intact.  Dressing was changed daily and wound was found to be healing well.  The  patient was started on Coumadin for DVT prophylaxis.  Adjustments in  doses were made according to daily protimes by the pharmacist at [facility].  Physical therapy assisted the patient in weightbearing as tolerated  ambulation and she was able to ambulate as much as 120 feet prior to  discharge.  She utilized a rolling walker.  She tolerated range of  motion and strengthening exercises without difficulty.  On September 25, 2007, she was discharged to her home in stable condition.   PERTINENT LABORATORY VALUES:  CBC on admission with WBC 14.5, hemoglobin  and hematocrit 14.6 and 44.2.  Prior to discharge, WBC 17.3.  INR at  discharge 1.7.  Chemistry studies on admission within normal limits.  Potassium on repeat value of 3.2.  The patient was given oral potassium  supplements.   PLAN:  The patient was discharged to her home.  Medication  reconciliation form reviewed with the patient and she will resume all  home medications as taken prior to admission.  She will stop her baby  aspirin.  Coumadin 5 mg daily, Robaxin  500 mg 1 every 8 hours as needed  for spasm and Darvocet-N 100 one every 4-6 hours as needed for pain.  She will continue to be weightbearing as tolerated and will continue  using a knee immobilizer for ambulation only.  Dressing change to be  done daily and she will be allowed to shower when there is no wound  drainage.  She will continue with range of motion and stretching  exercises.  Follow up with Dr. Ophelia Charter in 1 week from surgery.  All  questions encouraged and answered.   CONDITION ON DISCHARGE:  Stable.      Wende Neighbors, P.A.      Mark C. Ophelia Charter, M.D.  Electronically Signed    SMV/MEDQ  D:  11/01/2007  T:  11/02/2007  Job:  [id number]
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
## 2010-11-13 NOTE — Op Note (Signed)
NAME:  Susan Clark, Susan Clark NO.:  [id number]   MEDICAL RECORD NO.:  [id number]          PATIENT TYPE:  AMB   LOCATION:  DAY                          FACILITY[facility]  [facility]   PHYSICIAN:  Excell Seltzer. Annabell Howells, M.D.    DATE OF BIRTH:  [birth year]   DATE OF PROCEDURE:  12/24/2004  DATE OF DISCHARGE:                                 OPERATIVE REPORT   PROCEDURE:  Lynx suburethral sling.   PREOPERATIVE DIAGNOSIS:  Stress incontinence.   POSTOPERATIVE DIAGNOSIS:  Stress incontinence.   SURGEON:  Excell Seltzer. Annabell Howells, M.D.   ANESTHESIA:  General.   ESTIMATED BLOOD LOSS:  50 cc.   COMPLICATIONS:  None.   INDICATIONS:  Ms. Berhe is a 77 year old white female with stress  incontinence, who has selected to undergo a suburethral sling.   FINDINGS/PROCEDURE:  The patient was taken to the operating room.  She was  placed in the lithotomy position per induction of anesthetic, because of a  history of total knee replacement.  She had underwent an induction of a  general anesthetic.  She had received Cipro preoperatively.  Her mons was  shaved.  She was prepped with Betadine solution and draped in the usual  sterile fashion.  A Foley catheter was inserted and the bladder was drained.  The anterior vaginal vault was infiltrated with approximately 4 cc of 1%  lidocaine with epinephrine.  A midline anterior vaginal wall incision was  made over the midurethral level with the knife.  This was carried down  through the vaginal mucosa and to the level of the pubourethral fascia.  The  incision was extended approximately 2 cm on each side.  Two separate  incisions were made over the pubis, 2 cm lateral to the midline on each  side.  The fat was spread to the fascia with a hemostat.  The right [place]  trocar was then passed; the tip was carried down to the top of the pubis,  walked along the back of the pubis and then guided into the vaginal incision  with a finger in the right side of the vaginal incision  (which was also used  to push the urethra to the contralateral side).  This was then repeated on  the left side.  Once both trocars were passed, cystoscopy was performed  using a 22-French scope with 70-degree lens.  No evidence of bladder wall or  urethral injury was noted.  At this point the [place] mesh was secured to the  trocars and pulled back up to the abdominal incisions.  Repeat cystoscopy  once again revealed no abnormalities.  At this point the Foley catheter was  re-inserted.  The [place] mesh was tensioned appropriately.  The sheath was  removed.  The anterior vaginal wall was closed with a running, locked 2-0  Vicryl suture.  The abdominal ends of the mesh were trimmed.  The abdominal  incisions were closed with Dermabond.  At this point a 2-inch Iodoform  vaginal pack was placed.  The Foley was placed to straight drainage.  The  patient was taken down from lithotomy position.  Her anesthetic was  reversed.  She was moved to the recovery room in stable condition.  There  were no complications.       JJW/MEDQ  D:  12/24/2004  T:  12/24/2004  Job:  [id number]   cc:   Cassell Clement, M.D.  [address], [address]  [place]  [place] [address]  Fax: [phone]

## 2010-11-13 NOTE — H&P (Signed)
NAME:  Susan Clark, Susan Clark.                       ACCOUNT NO.:  [id number]   MEDICAL RECORD NO.:  [id number]                   PATIENT TYPE:  OIB   LOCATION:                                       FACILITY:  [facility]   PHYSICIAN:  Vesta Mixer, M.D.              DATE OF BIRTH:  October 24, 1928   DATE OF ADMISSION:  09/26/2002  DATE OF DISCHARGE:                                HISTORY & PHYSICAL   HISTORY OF PRESENT ILLNESS:  The patient is middle-aged female with a  history of chest pain. She recently had a stress Cardiolite study which  revealed anteroapical ischemia. She is now being admitted for further  evaluation including a heart catheterization.   The patient has a history of hypertension, hypercholesterolemia, and a  family history of coronary artery disease.  She recently has developed some  episodes of chest pain and some shortness of breath.  She was seen by Dr.  Patty Sermons and was set up for a stress Cardiolite study.   Since the Cardiolite study, she has continued to have some episodes of  shortness of breath and some occasional chest pain.   CURRENT MEDICATIONS:  1. Premarin 0.625 mg a day.  2. Nitroglycerin patch 0.02 mg an hour.  3. Inderal LA 80 mg a day.  4. Cardizem CD 300 mg a day.  5. Dipyridamole 25 mg twice a day.  6. Lorazepam as needed.  7. Ecotrin every other day.   PAST MEDICAL HISTORY:  1. Hypertension.  2. Hypercholesterolemia.  3. Chest pain.   SOCIAL HISTORY:  The patient does not smoke and does not drink alcohol.   FAMILY HISTORY:  Positive for coronary artery disease.   REVIEW OF SYSTEMS:  Essentially negative.   PHYSICAL EXAMINATION:  GENERAL:  She is an elderly female in no acute  distress. She is alert and oriented x 3 and mood and affect are normal.  VITAL SIGNS:  Weight 173. Blood pressure 124/70, heart rate 60.  NECK:  2+ carotids. No bruits, no JVD, no thyromegaly.  LUNGS:  Clear to auscultation.  HEART:  Regular rate with S1, S2.  She  has no murmurs, gallops, or rubs.  ABDOMEN:  Good bowel sounds and nontender.  EXTREMITIES:  No clubbing, cyanosis, or edema.  NEUROLOGIC:  Nonfocal.   LABORATORY DATA:  EKG reveals normal sinus rhythm.  She has Q waves in lead  V1 through V2.  The EKG is otherwise unremarkable.   ASSESSMENT:  The patient presents with some episodes of chest discomfort.  She has evidence of apical ischemia.  We have discussed the risks, benefits,  and options concerning heart catheterization. She understands and agrees to  proceed.  Vesta Mixer, M.D.    PJN/MEDQ  D:  09/23/2002  T:  09/24/2002  Job:  [id number]   cc:   Marjory Lies, M.D.  [address]  [place]  [place] [address]  Fax: [phone]   Cassell Clement, M.D.  [address]  [place]  [place] [address]  Fax: [phone]

## 2010-11-13 NOTE — Discharge Summary (Signed)
NAME:  Susan Clark, Susan Clark NO.:  [id number]   MEDICAL RECORD NO.:  [id number]          PATIENT TYPE:  INP   LOCATION:  5029                         FACILITY:  [facility]   PHYSICIAN:  Mark C. Ophelia Charter, M.D.    DATE OF BIRTH:  Jun 28, 1929   DATE OF ADMISSION:  09/22/2007  DATE OF DISCHARGE:  09/25/2007                               DISCHARGE SUMMARY   FINAL DIAGNOSIS:  Right total knee arthroplasty, revision for  polyethylene wear.   This 77 year old female had a right total knee arthroplasty in 1995, now  14 years later she has had wear of the polyethylene to the point where  she was getting some metal debris in her knee with progressive pain.  The patient's medications include Norvasc 5 mg, propranolol 40 mg  b.i.d., hydrochlorothiazide 12.5 mg daily, Zocor 20 mg daily, zolpidem  10 mg p.o. daily, aspirin 81 mg one a day, multivitamins at times t.i.d.   The patient was admitted after informed consent for revision.  She had  good prosthesis bone interface with no evidence of loosening and the  plan was for polyethylene exchange.  Lab work included Automotive engineer  with hemoglobin of 14.6.  PT-PTT were normal.  Chemistry panel was  normal as well.  Urinalysis showed no evidence of UTI.   HOSPITAL COURSE:  The patient was admitted after informed consent, taken  to the operating room where she underwent exchange of polyethylene  bearing as well as change of the patella.  The patella was the component  that was worn down and had metal-on-metal.  There was some destruction  at the edge of the poly causing poly debris, but no damage to the  femoral component or wear and the trochlea groove with scratches.  Poly  was popped off from a brace and polyethylene DePuy level bearings were  changed.  Postoperatively, the patient was immediately weightbearing,  was seen by PT/OT, with Coumadin protocol, made rapid progress, and was  discharged home taking Darvocet, Robaxin, and Coumadin  with the patient  4 times rechecked.  Office followup 1 week after discharge.      Mark C. Ophelia Charter, M.D.  Electronically Signed     MCY/MEDQ  D:  10/30/2007  T:  10/31/2007  Job:  [id number]

## 2010-12-04 ENCOUNTER — Encounter: Payer: Self-pay | Admitting: Cardiology

## 2010-12-07 ENCOUNTER — Other Ambulatory Visit: Payer: Self-pay | Admitting: *Deleted

## 2010-12-07 DIAGNOSIS — E785 Hyperlipidemia, unspecified: Secondary | ICD-10-CM

## 2010-12-08 ENCOUNTER — Ambulatory Visit (INDEPENDENT_AMBULATORY_CARE_PROVIDER_SITE_OTHER): Payer: Medicare Other | Admitting: Cardiology

## 2010-12-08 ENCOUNTER — Encounter: Payer: Self-pay | Admitting: Cardiology

## 2010-12-08 ENCOUNTER — Other Ambulatory Visit (INDEPENDENT_AMBULATORY_CARE_PROVIDER_SITE_OTHER): Payer: Medicare Other | Admitting: *Deleted

## 2010-12-08 VITALS — BP 134/70 | HR 51 | Ht 64.0 in | Wt 162.8 lb

## 2010-12-08 DIAGNOSIS — Z79899 Other long term (current) drug therapy: Secondary | ICD-10-CM

## 2010-12-08 DIAGNOSIS — I259 Chronic ischemic heart disease, unspecified: Secondary | ICD-10-CM

## 2010-12-08 DIAGNOSIS — E785 Hyperlipidemia, unspecified: Secondary | ICD-10-CM

## 2010-12-08 DIAGNOSIS — G4700 Insomnia, unspecified: Secondary | ICD-10-CM

## 2010-12-08 DIAGNOSIS — I8390 Asymptomatic varicose veins of unspecified lower extremity: Secondary | ICD-10-CM | POA: Insufficient documentation

## 2010-12-08 DIAGNOSIS — I119 Hypertensive heart disease without heart failure: Secondary | ICD-10-CM

## 2010-12-08 LAB — LIPID PANEL
HDL: 72.7 mg/dL (ref >39.00)
LDL Cholesterol: 95 mg/dL (ref 0–99)
VLDL: 13.8 mg/dL (ref 0.0–40.0)

## 2010-12-08 LAB — BASIC METABOLIC PANEL
Calcium: 9.8 mg/dL (ref 8.4–10.5)
GFR: 77.84 mL/min (ref >60.00)
Glucose, Bld: 108 mg/dL — ABNORMAL HIGH (ref 70–99)
Sodium: 133 mEq/L — ABNORMAL LOW (ref 135–145)

## 2010-12-08 LAB — HEPATIC FUNCTION PANEL
Albumin: 4.9 g/dL (ref 3.5–5.2)
Alkaline Phosphatase: 78 U/L (ref 39–117)
Total Bilirubin: 1.2 mg/dL (ref 0.3–1.2)

## 2010-12-08 MED ORDER — HYDROCHLOROTHIAZIDE 12.5 MG PO CAPS: 12.5000 mg | ORAL_CAPSULE | Freq: Every day | ORAL | Status: DC

## 2010-12-08 MED ORDER — ZOLPIDEM TARTRATE 10 MG PO TABS: 10.0000 mg | ORAL_TABLET | Freq: Every evening | ORAL | Status: DC | PRN

## 2010-12-08 NOTE — Assessment & Plan Note (Signed)
Patient has a history of ischemic heart disease and is on statin therapy in the form of simvastatin 20 mg daily.  She has symptoms of arthritis but is not having any myalgias.  Blood work from today is pending.

## 2010-12-08 NOTE — Assessment & Plan Note (Signed)
The patient has a history of ischemic heart disease.  She has not been expressing any recurrent chest pain or angina.  She had a drug-eluting stent placed to her mid LAD in 2004.

## 2010-12-08 NOTE — Progress Notes (Signed)
Susan Clark Date of Birth:  [birth year] [facility] / [facility] [address]   [address] [place], [place]  [address] [phone]           Fax   [phone]  History of Present Illness: This pleasant 77 year old woman is seen for a scheduled 4 month followup office visit.  She has a history of hypertensive cardiovascular disease and history of ischemic heart disease.  She had stenting of her mid LAD artery in 2004 with a Taxus drug-eluting stent.  Her last nuclear stress test was 09/13/07 and showed no evidence of ischemia.  The patient had an echocardiogram on 04/04/06 which showed a ejection fraction of 60% and no regional wall motion abnormalities and there was mild aortic valvular regurgitation.  Patient has a past history of high blood pressure and is on a multidrug regimen.Since last visit the patient has had no new cardiovascular symptoms including no angina pectoris dizzy spells or palpitations.  Current Outpatient Prescriptions  Medication Sig Dispense Refill  . amLODipine (NORVASC) 5 MG tablet Take 5 mg by mouth daily.        Marland Kitchen aspirin 81 MG EC tablet Take 81 mg by mouth daily.        . Cholecalciferol (VITAMIN D) 2000 UNITS CAPS Take 1 capsule by mouth daily.        . hydrochlorothiazide (,MICROZIDE/HYDRODIURIL,) 12.5 MG capsule Take 1 capsule (12.5 mg total) by mouth daily.  90 capsule  3  . Multiple Vitamin (MULTIVITAMIN) tablet Take 1 tablet by mouth daily.        . propranolol (INDERAL) 40 MG tablet Take 40 mg by mouth 2 (two) times daily.        . simvastatin (ZOCOR) 20 MG tablet Take 20 mg by mouth at bedtime.        Marland Kitchen zolpidem (AMBIEN) 10 MG tablet Take 1 tablet (10 mg total) by mouth at bedtime as needed.  90 tablet  1  . DISCONTD: hydrochlorothiazide (,MICROZIDE/HYDRODIURIL,) 12.5 MG capsule Take 12.5 mg by mouth daily.        Marland Kitchen DISCONTD: zolpidem (AMBIEN) 10 MG tablet Take 10 mg by mouth at bedtime as needed.          Allergies  Allergen Reactions    . Morphine And Related   . Prednisone     Patient Active Problem List  Diagnoses  . Hyperlipidemia  . Benign hypertensive heart disease without heart failure  . Ischemic heart disease  . Varicose veins    History  Smoking status  . Never Smoker   Smokeless tobacco  . Not on file    History  Alcohol Use No    Family History  Problem Relation Age of Onset  . Heart disease Father   . Arthritis Father   . Cancer Sister     colon  . Hypertension Mother   . Emphysema Brother     Review of Systems: Constitutional: no fever chills diaphoresis or fatigue or change in weight.  Head and neck: no hearing loss, no epistaxis, no photophobia or visual disturbance. Respiratory: No cough, shortness of breath or wheezing. Cardiovascular: No chest pain peripheral edema, palpitations. Gastrointestinal: No abdominal distention, no abdominal pain, no change in bowel habits hematochezia or melena. Genitourinary: No dysuria, no frequency, no urgency, no nocturia. Musculoskeletal:No arthralgias, no back pain, no gait disturbance or myalgias. Neurological: No dizziness, no headaches, no numbness, no seizures, no syncope, no weakness, no tremors. Hematologic: No lymphadenopathy, no easy bruising.  Psychiatric: No confusion, no hallucinations, no sleep disturbance.    Physical Exam: Filed Vitals:   12/08/10 0901  BP: 134/70  Pulse: 51  The general appearance reveals a well-developed well-nourished woman in no distress.The head and neck exam reveals pupils equal and reactive.  Extraocular movements are full.  There is no scleral icterus.  The mouth and pharynx are normal.  The neck is supple.  The carotids reveal no bruits.  The jugular venous pressure is normal.  The  thyroid is not enlarged.  There is no lymphadenopathy.  The chest is clear to percussion and auscultation.  There are no rales or rhonchi.  Expansion of the chest is symmetrical.  The precordium is quiet.  The first heart sound  is normal.  The second heart sound is physiologically split.  There is nor gallop rub or click.There is a soft systolic ejection murmur at the base.  There is no abnormal lift or heave.  The abdomen is soft and nontender.  The bowel sounds are normal.  The liver and spleen are not enlarged.  There are no abdominal masses.  There are no abdominal bruits.  Extremities reveal good pedal pulses.  There is no phlebitis or edema.She does have  varicose veins on the in her aspect of her ankles.  There is no cyanosis or clubbing.  Strength is normal and symmetrical in all extremities.  There is no lateralizing weakness.  There are no sensory deficits.  The skin is warm and dry.  There is no rash.  EKG shows sinus bradycardia and first degree AV block and no ischemic changes.  Assessment / Plan: Continue same medication and recheck in 4 months for followup office visit and fasting lab work.  She will contact The vascular surgeons about her varicose vein problem of the ankles

## 2010-12-08 NOTE — Assessment & Plan Note (Signed)
The patient has a history of essential hypertension.  Since last visit she's had no new is referable to her blood pressure.  She denies any dizziness or syncope or headaches.

## 2010-12-08 NOTE — Assessment & Plan Note (Signed)
The patient has had some varicose veins of her ankles.  The pains have become more prominent and she is concerned about them.  We will refer her to the vein and vascular surgical group for an evaluation.  I gave her the name of Dr. Fredda Hammed she will call to make her own appointment

## 2010-12-09 ENCOUNTER — Encounter: Payer: Self-pay | Admitting: Cardiology

## 2010-12-09 ENCOUNTER — Telehealth: Payer: Self-pay | Admitting: Cardiology

## 2010-12-09 NOTE — Progress Notes (Signed)
Advised patient

## 2010-12-09 NOTE — Telephone Encounter (Signed)
Advised patient of lab resutls

## 2010-12-09 NOTE — Telephone Encounter (Signed)
Message copied by Karle Plumber on Wed Dec 09, 2010  2:19 PM ------      Message from: Cassell Clement      Created: Tue Dec 08, 2010  9:15 PM       Please report to patient.The chemistries are good.  The lipids are good.  Stay on same meds

## 2010-12-09 NOTE — Telephone Encounter (Signed)
Message copied by Burnell Blanks on Wed Dec 09, 2010  3:49 PM ------      Message from: Cassell Clement      Created: Tue Dec 08, 2010  9:15 PM       Please report to patient.The chemistries are good.  The lipids are good.  Stay on same meds

## 2011-03-22 LAB — COMPREHENSIVE METABOLIC PANEL
AST: 23
Albumin: 3.9
Calcium: 9.6
Chloride: 94 — ABNORMAL LOW
Creatinine, Ser: 0.75
GFR calc Af Amer: >60
Total Bilirubin: 0.9
Total Protein: 7.8

## 2011-03-22 LAB — BASIC METABOLIC PANEL
CO2: 34 — ABNORMAL HIGH
Chloride: 93 — ABNORMAL LOW
GFR calc non Af Amer: >60
Glucose, Bld: 100 — ABNORMAL HIGH
Potassium: 3.2 — ABNORMAL LOW
Sodium: 135

## 2011-03-22 LAB — DIFFERENTIAL
Eosinophils Relative: 1
Lymphocytes Relative: 39
Lymphs Abs: 5.7 — ABNORMAL HIGH
Monocytes Absolute: 2.1 — ABNORMAL HIGH
Monocytes Relative: 14 — ABNORMAL HIGH
Neutro Abs: 6.6

## 2011-03-22 LAB — CBC
HCT: 36.6
HCT: 38.2
Hemoglobin: 12.9
MCV: 76.4 — ABNORMAL LOW
MCV: 76.5 — ABNORMAL LOW
Platelets: 159
Platelets: 227
RDW: 14
RDW: 14.1
WBC: 14.5 — ABNORMAL HIGH
WBC: 17.3 — ABNORMAL HIGH

## 2011-03-22 LAB — APTT: aPTT: 32

## 2011-03-22 LAB — PROTIME-INR: INR: 1.7 — ABNORMAL HIGH

## 2011-04-12 LAB — BASIC METABOLIC PANEL
BUN: 8
CO2: 29
Chloride: 94 — ABNORMAL LOW
GFR calc Af Amer: >60
GFR calc Af Amer: >60
GFR calc non Af Amer: >60
Glucose, Bld: 109 — ABNORMAL HIGH
Glucose, Bld: 99
Potassium: 3.4 — ABNORMAL LOW
Potassium: 3.5
Potassium: 4
Sodium: 126 — ABNORMAL LOW
Sodium: 127 — ABNORMAL LOW

## 2011-04-12 LAB — DIFFERENTIAL
Eosinophils Absolute: 0.2
Eosinophils Relative: 1
Lymphocytes Relative: 25
Lymphs Abs: 4.7 — ABNORMAL HIGH
Monocytes Absolute: 3.6 — ABNORMAL HIGH
Monocytes Relative: 19 — ABNORMAL HIGH

## 2011-04-12 LAB — CBC
HCT: 22.5 — ABNORMAL LOW
HCT: 27.2 — ABNORMAL LOW
HCT: 27.7 — ABNORMAL LOW
HCT: 28.2 — ABNORMAL LOW
Hemoglobin: 7.4 — CL
Hemoglobin: 8.7 — ABNORMAL LOW
Hemoglobin: 9.1 — ABNORMAL LOW
Hemoglobin: 9.2 — ABNORMAL LOW
MCHC: 32.8
MCV: 65 — ABNORMAL LOW
MCV: 68.8 — ABNORMAL LOW
Platelets: 188
RBC: 4.03
RBC: 4.12
RBC: 4.18
RDW: 16.5 — ABNORMAL HIGH
RDW: 20 — ABNORMAL HIGH
WBC: 19.4 — ABNORMAL HIGH
WBC: 28.3 — ABNORMAL HIGH

## 2011-04-12 LAB — CROSSMATCH: ABO/RH(D): O POS

## 2011-04-13 LAB — DIFFERENTIAL
Basophils Relative: 1
Eosinophils Absolute: 0.1
Eosinophils Relative: 1
Lymphocytes Relative: 47 — ABNORMAL HIGH
Monocytes Absolute: 1.8 — ABNORMAL HIGH
Neutrophils Relative %: 38 — ABNORMAL LOW

## 2011-04-13 LAB — COMPREHENSIVE METABOLIC PANEL
ALT: 16
Alkaline Phosphatase: 76
BUN: 13
CO2: 30
Calcium: 9.9
GFR calc non Af Amer: >60
Glucose, Bld: 98
Total Protein: 7.8

## 2011-04-13 LAB — URINALYSIS, ROUTINE W REFLEX MICROSCOPIC
Glucose, UA: NEGATIVE
Hgb urine dipstick: NEGATIVE
Ketones, ur: NEGATIVE
Protein, ur: NEGATIVE
Urobilinogen, UA: 0.2

## 2011-04-13 LAB — CBC
HCT: 41.4
Hemoglobin: 13
MCHC: 31.4
RBC: 6.35 — ABNORMAL HIGH
RDW: 16.4 — ABNORMAL HIGH

## 2011-04-13 LAB — PATHOLOGIST SMEAR REVIEW

## 2011-04-13 LAB — PROTIME-INR
INR: 1
Prothrombin Time: 13.6

## 2011-05-21 ENCOUNTER — Encounter: Payer: Self-pay | Admitting: Cardiology

## 2011-05-21 ENCOUNTER — Ambulatory Visit (INDEPENDENT_AMBULATORY_CARE_PROVIDER_SITE_OTHER): Payer: Medicare Other | Admitting: Cardiology

## 2011-05-21 ENCOUNTER — Other Ambulatory Visit (INDEPENDENT_AMBULATORY_CARE_PROVIDER_SITE_OTHER): Payer: Medicare Other | Admitting: *Deleted

## 2011-05-21 VITALS — BP 158/77 | HR 49 | Ht 64.0 in | Wt 166.4 lb

## 2011-05-21 DIAGNOSIS — E785 Hyperlipidemia, unspecified: Secondary | ICD-10-CM

## 2011-05-21 DIAGNOSIS — G4700 Insomnia, unspecified: Secondary | ICD-10-CM

## 2011-05-21 DIAGNOSIS — Z79899 Other long term (current) drug therapy: Secondary | ICD-10-CM

## 2011-05-21 DIAGNOSIS — I119 Hypertensive heart disease without heart failure: Secondary | ICD-10-CM

## 2011-05-21 LAB — BASIC METABOLIC PANEL
BUN: 12 mg/dL (ref 6–23)
CO2: 31 mEq/L (ref 19–32)
Chloride: 96 mEq/L (ref 96–112)
Creatinine, Ser: 0.8 mg/dL (ref 0.4–1.2)
Glucose, Bld: 94 mg/dL (ref 70–99)
Potassium: 3.8 mEq/L (ref 3.5–5.1)

## 2011-05-21 LAB — HEPATIC FUNCTION PANEL
Alkaline Phosphatase: 67 U/L (ref 39–117)
Bilirubin, Direct: 0.2 mg/dL (ref 0.0–0.3)
Total Bilirubin: 0.9 mg/dL (ref 0.3–1.2)

## 2011-05-21 LAB — LIPID PANEL
Cholesterol: 139 mg/dL (ref 0–200)
LDL Cholesterol: 62 mg/dL (ref 0–99)
VLDL: 13.8 mg/dL (ref 0.0–40.0)

## 2011-05-21 MED ORDER — ZOLPIDEM TARTRATE 10 MG PO TABS: 10.0000 mg | ORAL_TABLET | Freq: Every evening | ORAL | Status: DC | PRN

## 2011-05-21 NOTE — Progress Notes (Signed)
Dalene Carrow Date of Birth:  1929-02-16 [facility] / [facility] [address]   [address] [place], [place]  [address] [phone]           Fax   [phone]  History of Present Illness: This pleasant 77 year old woman is seen for a scheduled four-month followup office visit.  She has a history of hypertensive cardiovascular disease and a history of ischemic heart disease.  She had a stent placed in her mid LAD artery in 2004 with a Taxus drug-eluting stent.  Her last nuclear stress test was 09/13/07 and showed no evidence of ischemia.  Her last echocardiogram 04/04/06 showed ejection fraction of 60% and no regional wall motion abnormalities and mild aortic valve regurgitation.  Since last visit she has been feeling well.  She continues to have problems with insomnia and has benefited from Ambien 10 mg at bedtime which we renewed today.  Current Outpatient Prescriptions  Medication Sig Dispense Refill  . amLODipine (NORVASC) 5 MG tablet Take 5 mg by mouth daily.        Marland Kitchen aspirin 81 MG EC tablet Take 81 mg by mouth daily.        . hydrochlorothiazide (,MICROZIDE/HYDRODIURIL,) 12.5 MG capsule Take 1 capsule (12.5 mg total) by mouth daily.  90 capsule  3  . propranolol (INDERAL) 40 MG tablet Take 40 mg by mouth 2 (two) times daily.        . simvastatin (ZOCOR) 20 MG tablet Take 20 mg by mouth at bedtime.        Marland Kitchen zolpidem (AMBIEN) 10 MG tablet Take 1 tablet (10 mg total) by mouth at bedtime as needed.  90 tablet  1    Allergies  Allergen Reactions  . Morphine And Related   . Prednisone     Patient Active Problem List  Diagnoses  . Hyperlipidemia  . Benign hypertensive heart disease without heart failure  . Ischemic heart disease  . Varicose veins    History  Smoking status  . Never Smoker   Smokeless tobacco  . Not on file    History  Alcohol Use No    Family History  Problem Relation Age of Onset  . Heart disease Father   . Arthritis Father   .  Cancer Sister     colon  . Hypertension Mother   . Emphysema Brother     Review of Systems: Constitutional: no fever chills diaphoresis or fatigue or change in weight.  Head and neck: no hearing loss, no epistaxis, no photophobia or visual disturbance. Respiratory: No cough, shortness of breath or wheezing. Cardiovascular: No chest pain peripheral edema, palpitations. Gastrointestinal: No abdominal distention, no abdominal pain, no change in bowel habits hematochezia or melena. Genitourinary: No dysuria, no frequency, no urgency, no nocturia. Musculoskeletal:No arthralgias, no back pain, no gait disturbance or myalgias. Neurological: No dizziness, no headaches, no numbness, no seizures, no syncope, no weakness, no tremors. Hematologic: No lymphadenopathy, no easy bruising. Psychiatric: No confusion, no hallucinations, no sleep disturbance.    Physical Exam: Filed Vitals:   05/21/11 0931  BP: 158/77  Pulse: 49   I rechecked her blood pressure literature in the examination and it was down to 140/70.The head and neck exam reveals pupils equal and reactive.  Extraocular movements are full.  There is no scleral icterus.  The mouth and pharynx are normal.  The neck is supple.  The carotids reveal no bruits.  The jugular venous pressure is normal.  The  thyroid  is not enlarged.  There is no lymphadenopathy.  The chest is clear to percussion and auscultation.  There are no rales or rhonchi.  Expansion of the chest is symmetrical.  The precordium is quiet.  The first heart sound is normal.  The second heart sound is physiologically split.  There is no murmur gallop rub or click.  There is no abnormal lift or heave.  The abdomen is soft and nontender.  The bowel sounds are normal.  The liver and spleen are not enlarged.  There are no abdominal masses.  There are no abdominal bruits.  Extremities reveal good pedal pulses.  There is no phlebitis or edema.  There is no cyanosis or clubbing.  Strength is  normal and symmetrical in all extremities.  There is no lateralizing weakness.  There are no sensory deficits.  The skin is warm and dry.  There is no rash.     Assessment / Plan: Continue present medication.  Recheck in 4 months for followup office visit lipid panel hepatic function panel basal metabolic panel and EKG

## 2011-05-21 NOTE — Assessment & Plan Note (Signed)
The patient is on simvastatin 20 mg daily.  She is not having any side effects from the simvastatin.

## 2011-05-21 NOTE — Assessment & Plan Note (Signed)
The patient is sleeping reasonably well with Ambien 10 mg at at bedtime.  She's not having any side effects from Ambien.  Continue same medicines

## 2011-05-21 NOTE — Patient Instructions (Addendum)
Your physician recommends that you continue on your current medications as directed. Please refer to the Current Medication list given to you today. Your physician recommends that you schedule a follow-up appointment in: 4 months with fasting labs (lp/hfp/bmet)

## 2011-05-21 NOTE — Assessment & Plan Note (Signed)
The patient is not expressing any chest pain or shortness of breath.  No dizziness or syncope.  No palpitations.

## 2011-05-25 ENCOUNTER — Encounter: Payer: Self-pay | Admitting: *Deleted

## 2011-07-27 ENCOUNTER — Other Ambulatory Visit: Payer: Self-pay | Admitting: Cardiology

## 2011-07-27 MED ORDER — AMLODIPINE BESYLATE 5 MG PO TABS: 5.0000 mg | ORAL_TABLET | Freq: Every day | ORAL | Status: DC

## 2011-07-27 MED ORDER — PROPRANOLOL HCL 40 MG PO TABS: 40.0000 mg | ORAL_TABLET | Freq: Two times a day (BID) | ORAL | Status: DC

## 2011-07-27 MED ORDER — SIMVASTATIN 20 MG PO TABS: 20.0000 mg | ORAL_TABLET | Freq: Every day | ORAL | Status: DC

## 2011-09-20 ENCOUNTER — Ambulatory Visit (INDEPENDENT_AMBULATORY_CARE_PROVIDER_SITE_OTHER): Payer: Medicare Other | Admitting: Cardiology

## 2011-09-20 ENCOUNTER — Encounter: Payer: Self-pay | Admitting: Cardiology

## 2011-09-20 ENCOUNTER — Other Ambulatory Visit (INDEPENDENT_AMBULATORY_CARE_PROVIDER_SITE_OTHER): Payer: Medicare Other

## 2011-09-20 VITALS — BP 130/80 | HR 50 | Ht 64.0 in | Wt 163.0 lb

## 2011-09-20 DIAGNOSIS — I2510 Atherosclerotic heart disease of native coronary artery without angina pectoris: Secondary | ICD-10-CM

## 2011-09-20 DIAGNOSIS — I351 Nonrheumatic aortic (valve) insufficiency: Secondary | ICD-10-CM

## 2011-09-20 DIAGNOSIS — G4700 Insomnia, unspecified: Secondary | ICD-10-CM

## 2011-09-20 DIAGNOSIS — E7800 Pure hypercholesterolemia, unspecified: Secondary | ICD-10-CM

## 2011-09-20 DIAGNOSIS — I259 Chronic ischemic heart disease, unspecified: Secondary | ICD-10-CM

## 2011-09-20 DIAGNOSIS — I359 Nonrheumatic aortic valve disorder, unspecified: Secondary | ICD-10-CM

## 2011-09-20 DIAGNOSIS — I119 Hypertensive heart disease without heart failure: Secondary | ICD-10-CM

## 2011-09-20 LAB — LIPID PANEL
LDL Cholesterol: 59 mg/dL (ref 0–99)
Total CHOL/HDL Ratio: 2
VLDL: 16 mg/dL (ref 0.0–40.0)

## 2011-09-20 LAB — BASIC METABOLIC PANEL
BUN: 15 mg/dL (ref 6–23)
CO2: 32 mEq/L (ref 19–32)
Chloride: 95 mEq/L — ABNORMAL LOW (ref 96–112)
Glucose, Bld: 89 mg/dL (ref 70–99)
Potassium: 3.8 mEq/L (ref 3.5–5.1)

## 2011-09-20 LAB — HEPATIC FUNCTION PANEL
Alkaline Phosphatase: 70 U/L (ref 39–117)
Bilirubin, Direct: 0.1 mg/dL (ref 0.0–0.3)
Total Bilirubin: 0.9 mg/dL (ref 0.3–1.2)
Total Protein: 7.6 g/dL (ref 6.0–8.3)

## 2011-09-20 MED ORDER — PROPRANOLOL HCL 40 MG PO TABS: 40.0000 mg | ORAL_TABLET | Freq: Two times a day (BID) | ORAL | Status: DC

## 2011-09-20 MED ORDER — AMLODIPINE BESYLATE 5 MG PO TABS: 5.0000 mg | ORAL_TABLET | Freq: Every day | ORAL | Status: DC

## 2011-09-20 MED ORDER — ZOLPIDEM TARTRATE 10 MG PO TABS: 10.0000 mg | ORAL_TABLET | Freq: Every evening | ORAL | Status: DC | PRN

## 2011-09-20 MED ORDER — SIMVASTATIN 20 MG PO TABS: 20.0000 mg | ORAL_TABLET | Freq: Every day | ORAL | Status: DC

## 2011-09-20 MED ORDER — HYDROCHLOROTHIAZIDE 12.5 MG PO CAPS: 12.5000 mg | ORAL_CAPSULE | Freq: Every day | ORAL | Status: DC

## 2011-09-20 NOTE — Assessment & Plan Note (Signed)
The patient has not been expressing any exertional chest pain.  She's not having any rest pain.  She has had some palpitations related to the steroid injection into her back.

## 2011-09-20 NOTE — Progress Notes (Signed)
Susan Clark Date of Birth:  10-12-1928 [facility] [address] [place], [place]  [address] [phone]         Fax   [phone]  History of Present Illness: This pleasant 78 year old woman is seen for a scheduled followup office visit.  Past history of hypertensive cardiovascular disease.  She has a history of ischemic heart disease.  She had a drug-eluting stent placed to her mid LAD artery in 2004.  She has not had any recurrent angina.  Her last nuclear stress test on 09/13/07 showed no evidence of ischemia.  Her last echocardiogram 04/04/06 showed mild aortic regurgitation and no regional wall motion abnormalities.  Since her last saw her she has been having a lot of problems with low back pain.  She saw her orthopedist Dr. Kevan Ny who diagnosed problems with her L4/L5 disc and he referred her to Dr. Alvester Morin who gave her an injection of steroids.  The steroids have helped partially.  She has noted some palpitations occurring after she received the steroid injection.  Current Outpatient Prescriptions  Medication Sig Dispense Refill  . amLODipine (NORVASC) 5 MG tablet Take 1 tablet (5 mg total) by mouth daily.  30 tablet  6  . aspirin 81 MG EC tablet Take 81 mg by mouth daily.        . hydrochlorothiazide (,MICROZIDE/HYDRODIURIL,) 12.5 MG capsule Take 1 capsule (12.5 mg total) by mouth daily.  90 capsule  3  . HYDROcodone-acetaminophen (NORCO) 5-325 MG per tablet Take 1 tablet by mouth every 6 (six) hours as needed.      . propranolol (INDERAL) 40 MG tablet Take 1 tablet (40 mg total) by mouth 2 (two) times daily.  60 tablet  6  . simvastatin (ZOCOR) 20 MG tablet Take 1 tablet (20 mg total) by mouth at bedtime.  30 tablet  6  . zolpidem (AMBIEN) 10 MG tablet Take 1 tablet (10 mg total) by mouth at bedtime as needed for sleep.  90 tablet  1    Allergies  Allergen Reactions  . Morphine And Related   . Prednisone     Patient Active Problem List  Diagnoses  .  Hyperlipidemia  . Benign hypertensive heart disease without heart failure  . Ischemic heart disease  . Varicose veins  . Insomnia    History  Smoking status  . Never Smoker   Smokeless tobacco  . Not on file    History  Alcohol Use No    Family History  Problem Relation Age of Onset  . Heart disease Father   . Arthritis Father   . Cancer Sister     colon  . Hypertension Mother   . Emphysema Brother     Review of Systems: Constitutional: no fever chills diaphoresis or fatigue or change in weight.  Head and neck: no hearing loss, no epistaxis, no photophobia or visual disturbance. Respiratory: No cough, shortness of breath or wheezing. Cardiovascular: No chest pain peripheral edema, palpitations. Gastrointestinal: No abdominal distention, no abdominal pain, no change in bowel habits hematochezia or melena. Genitourinary: No dysuria, no frequency, no urgency, no nocturia. Musculoskeletal:No arthralgias, no back pain, no gait disturbance or myalgias. Neurological: No dizziness, no headaches, no numbness, no seizures, no syncope, no weakness, no tremors. Hematologic: No lymphadenopathy, no easy bruising. Psychiatric: No confusion, no hallucinations, no sleep disturbance.    Physical Exam: Filed Vitals:   09/20/11 0843  BP: 130/80  Pulse: 50   the general appearance reveals a well-developed  well-nourished woman in no distress.The head and neck exam reveals pupils equal and reactive.  Extraocular movements are full.  There is no scleral icterus.  The mouth and pharynx are normal.  The neck is supple.  The carotids reveal no bruits.  The jugular venous pressure is normal.  The  thyroid is not enlarged.  There is no lymphadenopathy.  The chest is clear to percussion and auscultation.  There are no rales or rhonchi.  Expansion of the chest is symmetrical.  The precordium is quiet.  The first heart sound is normal.  The second heart sound is physiologically split.  There is no   gallop rub or click.  There is a soft aortic insufficiency murmur at the left sternal edge There is no abnormal lift or heave.  The abdomen is soft and nontender.  The bowel sounds are normal.  The liver and spleen are not enlarged.  There are no abdominal masses.  There are no abdominal bruits.  Extremities reveal good pedal pulses.  There is no phlebitis or edema.  There is no cyanosis or clubbing.  Strength is normal and symmetrical in all extremities.  There is no lateralizing weakness.  There are no sensory deficits.  The skin is warm and dry.  There is no rash.     Assessment / Plan: Continue same medication.  Consider followup echocardiogram after next office visit.

## 2011-09-20 NOTE — Assessment & Plan Note (Signed)
The patient has a history of high blood pressure.  She has not been having any headaches or dizzy spells.  No symptoms of congestive heart failure.

## 2011-09-20 NOTE — Assessment & Plan Note (Signed)
The patient has a history of chronic insomnia for which she takes Ambien 10 mg at bedtime with good results.

## 2011-09-20 NOTE — Progress Notes (Signed)
Quick Note:  Please report to patient. The recent labs are stable. Continue same medication and careful diet. ______ 

## 2011-09-20 NOTE — Patient Instructions (Signed)
Will send new Rx's to [facility]  Will obtain labs today and call you with the results (LP/BMET/HFP)  Your physician wants you to follow-up in: 4 Months You will receive a reminder letter in the mail two months in advance. If you don't receive a letter, please call our office to schedule the follow-up appointment.

## 2011-09-23 ENCOUNTER — Telehealth: Payer: Self-pay | Admitting: *Deleted

## 2011-09-23 NOTE — Telephone Encounter (Signed)
Message copied by Burnell Blanks on Thu Sep 23, 2011  5:32 PM ------      Message from: Cassell Clement      Created: Mon Sep 20, 2011  2:20 PM       Please report to patient.  The recent labs are stable. Continue same medication and careful diet.

## 2011-12-31 ENCOUNTER — Other Ambulatory Visit: Payer: Self-pay | Admitting: Family Medicine

## 2011-12-31 DIAGNOSIS — Z1231 Encounter for screening mammogram for malignant neoplasm of breast: Secondary | ICD-10-CM

## 2012-01-11 ENCOUNTER — Ambulatory Visit: Payer: Medicare Other

## 2012-01-18 ENCOUNTER — Encounter: Payer: Self-pay | Admitting: Cardiology

## 2012-01-18 ENCOUNTER — Ambulatory Visit (INDEPENDENT_AMBULATORY_CARE_PROVIDER_SITE_OTHER): Payer: Medicare Other | Admitting: Cardiology

## 2012-01-18 VITALS — BP 140/80 | HR 47 | Ht 64.0 in | Wt 168.0 lb

## 2012-01-18 DIAGNOSIS — I119 Hypertensive heart disease without heart failure: Secondary | ICD-10-CM

## 2012-01-18 DIAGNOSIS — I359 Nonrheumatic aortic valve disorder, unspecified: Secondary | ICD-10-CM

## 2012-01-18 DIAGNOSIS — I351 Nonrheumatic aortic (valve) insufficiency: Secondary | ICD-10-CM

## 2012-01-18 DIAGNOSIS — E7800 Pure hypercholesterolemia, unspecified: Secondary | ICD-10-CM

## 2012-01-18 DIAGNOSIS — E785 Hyperlipidemia, unspecified: Secondary | ICD-10-CM

## 2012-01-18 LAB — BASIC METABOLIC PANEL
BUN: 9 mg/dL (ref 6–23)
CO2: 31 mEq/L (ref 19–32)
Chloride: 90 mEq/L — ABNORMAL LOW (ref 96–112)
Potassium: 3.8 mEq/L (ref 3.5–5.1)

## 2012-01-18 LAB — LIPID PANEL
Cholesterol: 148 mg/dL (ref 0–200)
HDL: 65.7 mg/dL (ref >39.00)
LDL Cholesterol: 65 mg/dL (ref 0–99)
Total CHOL/HDL Ratio: 2
Triglycerides: 88 mg/dL (ref 0.0–149.0)
VLDL: 17.6 mg/dL (ref 0.0–40.0)

## 2012-01-18 LAB — HEPATIC FUNCTION PANEL
ALT: 15 U/L (ref 0–35)
AST: 24 U/L (ref 0–37)
Albumin: 4.4 g/dL (ref 3.5–5.2)
Alkaline Phosphatase: 66 U/L (ref 39–117)
Bilirubin, Direct: 0.1 mg/dL (ref 0.0–0.3)
Total Bilirubin: 1.2 mg/dL (ref 0.3–1.2)
Total Protein: 7.6 g/dL (ref 6.0–8.3)

## 2012-01-18 NOTE — Patient Instructions (Addendum)
Your physician recommends that you return for lab work in: 4 months for fasting lipid,liver, and bmp  Your physician recommends that you schedule a follow-up appointment in: 4 months with Dr. Patty Sermons  Your physician has requested that you have an echocardiogram. Echocardiography is a painless test that uses sound waves to create images of your heart. It provides your doctor with information about the size and shape of your heart and how well your heart's chambers and valves are working. This procedure takes approximately one hour. There are no restrictions for this procedure.    You  Have lab work to be drawn today  Bmp,lipid,liver.  We will call you with your results.

## 2012-01-18 NOTE — Progress Notes (Signed)
Quick Note:  Please report to patient. The recent labs are stable. Continue same medication and careful diet. ______ 

## 2012-01-18 NOTE — Progress Notes (Signed)
Susan Clark Date of Birth:  Jan 08, 1929 [facility] [address] [place], [place]  [address] [phone]         Fax   [phone]  History of Present Illness: This pleasant 78 year old woman is seen for a scheduled followup office visit.  She has a past history of ischemic heart disease.  She had a drug-eluting stent placed to her mid LAD in 2004.  She has had no recurrent angina and she had a nuclear stress test on 09/13/07 showed no evidence of ischemia.  The patient has a history of mild aortic insufficiency.  Her last echocardiogram was in 2007.  He has not been experiencing any symptoms of congestive heart failure.  Current Outpatient Prescriptions  Medication Sig Dispense Refill  . amLODipine (NORVASC) 5 MG tablet Take 1 tablet (5 mg total) by mouth daily.  90 tablet  5  . aspirin 81 MG EC tablet Take 81 mg by mouth daily.        . hydrochlorothiazide (MICROZIDE) 12.5 MG capsule Take 1 capsule (12.5 mg total) by mouth daily.  90 capsule  3  . HYDROcodone-acetaminophen (NORCO) 5-325 MG per tablet Take 1 tablet by mouth every 6 (six) hours as needed.      . propranolol (INDERAL) 40 MG tablet Take 1 tablet (40 mg total) by mouth 2 (two) times daily.  180 tablet  3  . simvastatin (ZOCOR) 20 MG tablet Take 1 tablet (20 mg total) by mouth at bedtime.  90 tablet  3  . zolpidem (AMBIEN) 10 MG tablet Take 1 tablet (10 mg total) by mouth at bedtime as needed for sleep.  90 tablet  1    Allergies  Allergen Reactions  . Morphine And Related   . Prednisone     Patient Active Problem List  Diagnosis  . Hyperlipidemia  . Benign hypertensive heart disease without heart failure  . Ischemic heart disease  . Varicose veins  . Insomnia  . Aortic insufficiency    History  Smoking status  . Never Smoker   Smokeless tobacco  . Not on file    History  Alcohol Use No    Family History  Problem Relation Age of Onset  . Heart disease Father   . Arthritis  Father   . Cancer Sister     colon  . Hypertension Mother   . Emphysema Brother     Review of Systems: Constitutional: no fever chills diaphoresis or fatigue or change in weight.  Head and neck: no hearing loss, no epistaxis, no photophobia or visual disturbance. Respiratory: No cough, shortness of breath or wheezing. Cardiovascular: No chest pain peripheral edema, palpitations. Gastrointestinal: No abdominal distention, no abdominal pain, no change in bowel habits hematochezia or melena. Genitourinary: No dysuria, no frequency, no urgency, no nocturia. Musculoskeletal:No arthralgias, no back pain, no gait disturbance or myalgias. Neurological: No dizziness, no headaches, no numbness, no seizures, no syncope, no weakness, no tremors. Hematologic: No lymphadenopathy, no easy bruising. Psychiatric: No confusion, no hallucinations, no sleep disturbance.    Physical Exam: Filed Vitals:   01/18/12 0942  BP: 140/80  Pulse: 47   the general appearance reveals a well-developed well-nourished woman in no distress.The head and neck exam reveals pupils equal and reactive.  Extraocular movements are full.  There is no scleral icterus.  The mouth and pharynx are normal.  The neck is supple.  The carotids reveal no bruits.  The jugular venous pressure is normal.  The  thyroid  is not enlarged.  There is no lymphadenopathy.  The chest is clear to percussion and auscultation.  There are no rales or rhonchi.  Expansion of the chest is symmetrical.  The precordium is quiet.  The first heart sound is normal.  The second heart sound is physiologically split.  There is a soft systolic ejection murmur at the base.  Questionable early diastolic aortic murmur.  There is no abnormal lift or heave.  The abdomen is soft and nontender.  The bowel sounds are normal.  The liver and spleen are not enlarged.  There are no abdominal masses.  There are no abdominal bruits.  Extremities reveal good pedal pulses.  There is no  phlebitis or edema.  There is no cyanosis or clubbing.  Strength is normal and symmetrical in all extremities.  There is no lateralizing weakness.  There are no sensory deficits.  The skin is warm and dry.  There is no rash.     Assessment / Plan:  Continue same medication.  Update 2-D echo.  Recheck 4 months for followup office visit and fasting lab work

## 2012-01-18 NOTE — Assessment & Plan Note (Signed)
The patient has not been having any orthopnea or paroxysmal nocturnal dyspnea.  She has occasional chest pain which goes away spontaneously and she does not have to take anything for it.  We will update her echocardiogram

## 2012-01-18 NOTE — Assessment & Plan Note (Signed)
Patient has a history of hyperlipidemia and is on simvastatin 20 mg daily.  Is not having any myalgias.  Blood work is pending today fasting

## 2012-01-18 NOTE — Assessment & Plan Note (Signed)
Blood pressure has been stable on current medications.  She denies any dizziness or syncope.  She has been less physically active because of chronic back pain.

## 2012-01-19 ENCOUNTER — Telehealth: Payer: Self-pay | Admitting: *Deleted

## 2012-01-19 NOTE — Telephone Encounter (Signed)
Advised patient of lab results  

## 2012-01-19 NOTE — Telephone Encounter (Signed)
Message copied by Burnell Blanks on Wed Jan 19, 2012 10:45 AM ------      Message from: Cassell Clement      Created: Tue Jan 18, 2012  4:21 PM       Please report to patient.  The recent labs are stable. Continue same medication and careful diet.

## 2012-01-25 ENCOUNTER — Ambulatory Visit
Admission: RE | Admit: 2012-01-25 | Discharge: 2012-01-25 | Disposition: A | Discharge status: Home / Self Care | Payer: Medicare Other | Source: Ambulatory Visit | Attending: Family Medicine | Admitting: Family Medicine

## 2012-01-25 DIAGNOSIS — Z1231 Encounter for screening mammogram for malignant neoplasm of breast: Secondary | ICD-10-CM

## 2012-01-27 ENCOUNTER — Ambulatory Visit (HOSPITAL_COMMUNITY): Payer: Medicare Other | Attending: Internal Medicine | Admitting: Radiology

## 2012-01-27 DIAGNOSIS — I351 Nonrheumatic aortic (valve) insufficiency: Secondary | ICD-10-CM

## 2012-01-27 DIAGNOSIS — I10 Essential (primary) hypertension: Secondary | ICD-10-CM | POA: Insufficient documentation

## 2012-01-27 DIAGNOSIS — I079 Rheumatic tricuspid valve disease, unspecified: Secondary | ICD-10-CM | POA: Insufficient documentation

## 2012-01-27 DIAGNOSIS — I080 Rheumatic disorders of both mitral and aortic valves: Secondary | ICD-10-CM | POA: Insufficient documentation

## 2012-01-27 DIAGNOSIS — E785 Hyperlipidemia, unspecified: Secondary | ICD-10-CM | POA: Insufficient documentation

## 2012-01-27 DIAGNOSIS — I359 Nonrheumatic aortic valve disorder, unspecified: Secondary | ICD-10-CM

## 2012-01-27 NOTE — Progress Notes (Signed)
Echocardiogram performed.  

## 2012-02-02 ENCOUNTER — Telehealth: Payer: Self-pay | Admitting: Cardiology

## 2012-02-02 NOTE — Telephone Encounter (Signed)
Advised of echo results 

## 2012-02-02 NOTE — Telephone Encounter (Signed)
Left message to call back  

## 2012-02-02 NOTE — Telephone Encounter (Signed)
Message copied by Burnell Blanks on Wed Feb 02, 2012  3:21 PM ------      Message from: Cassell Clement      Created: Thu Jan 27, 2012  5:12 PM       Please report.  The Echo is stable.  The aortic regurgitation is mild. The LV systolic function is normal.  Continue same meds.

## 2012-02-02 NOTE — Telephone Encounter (Signed)
New Problem:    Patient called in to know the results of her latest ECHO.  Please call back.

## 2012-02-02 NOTE — Telephone Encounter (Signed)
Message copied by Burnell Blanks on Wed Feb 02, 2012  4:51 PM ------      Message from: Cassell Clement      Created: Thu Jan 27, 2012  5:12 PM       Please report.  The Echo is stable.  The aortic regurgitation is mild. The LV systolic function is normal.  Continue same meds.

## 2012-02-02 NOTE — Telephone Encounter (Signed)
Patient returning nurse call she can be reached at [phone]

## 2012-04-05 ENCOUNTER — Other Ambulatory Visit: Payer: Self-pay | Admitting: Gastroenterology

## 2012-04-17 ENCOUNTER — Ambulatory Visit (HOSPITAL_COMMUNITY): Admission: RE | Admit: 2012-04-17 | Payer: Medicare Other | Source: Ambulatory Visit | Admitting: Gastroenterology

## 2012-04-17 ENCOUNTER — Encounter (HOSPITAL_COMMUNITY): Admission: RE | Payer: Self-pay | Source: Ambulatory Visit

## 2012-04-17 SURGERY — COLONOSCOPY
Anesthesia: Moderate Sedation

## 2012-04-20 ENCOUNTER — Other Ambulatory Visit: Payer: Self-pay | Admitting: Cardiology

## 2012-04-20 DIAGNOSIS — G4700 Insomnia, unspecified: Secondary | ICD-10-CM

## 2012-05-23 ENCOUNTER — Telehealth: Payer: Self-pay | Admitting: *Deleted

## 2012-05-23 ENCOUNTER — Encounter: Payer: Self-pay | Admitting: Cardiology

## 2012-05-23 ENCOUNTER — Ambulatory Visit (INDEPENDENT_AMBULATORY_CARE_PROVIDER_SITE_OTHER): Payer: Medicare Other | Admitting: Cardiology

## 2012-05-23 VITALS — BP 132/70 | HR 51 | Resp 19 | Ht 66.0 in | Wt 166.8 lb

## 2012-05-23 DIAGNOSIS — E7800 Pure hypercholesterolemia, unspecified: Secondary | ICD-10-CM

## 2012-05-23 DIAGNOSIS — I351 Nonrheumatic aortic (valve) insufficiency: Secondary | ICD-10-CM

## 2012-05-23 DIAGNOSIS — I259 Chronic ischemic heart disease, unspecified: Secondary | ICD-10-CM

## 2012-05-23 DIAGNOSIS — E785 Hyperlipidemia, unspecified: Secondary | ICD-10-CM

## 2012-05-23 DIAGNOSIS — G4700 Insomnia, unspecified: Secondary | ICD-10-CM

## 2012-05-23 DIAGNOSIS — I119 Hypertensive heart disease without heart failure: Secondary | ICD-10-CM

## 2012-05-23 DIAGNOSIS — I359 Nonrheumatic aortic valve disorder, unspecified: Secondary | ICD-10-CM

## 2012-05-23 DIAGNOSIS — I2510 Atherosclerotic heart disease of native coronary artery without angina pectoris: Secondary | ICD-10-CM

## 2012-05-23 LAB — BASIC METABOLIC PANEL
BUN: 9 mg/dL (ref 6–23)
CO2: 31 mEq/L (ref 19–32)
Glucose, Bld: 99 mg/dL (ref 70–99)
Potassium: 3.5 mEq/L (ref 3.5–5.1)
Sodium: 130 mEq/L — ABNORMAL LOW (ref 135–145)

## 2012-05-23 LAB — HEPATIC FUNCTION PANEL
AST: 24 U/L (ref 0–37)
Albumin: 4.2 g/dL (ref 3.5–5.2)
Alkaline Phosphatase: 67 U/L (ref 39–117)
Total Protein: 7.8 g/dL (ref 6.0–8.3)

## 2012-05-23 MED ORDER — HYDROCHLOROTHIAZIDE 12.5 MG PO CAPS: 12.5000 mg | ORAL_CAPSULE | Freq: Every day | ORAL | Status: DC

## 2012-05-23 MED ORDER — SIMVASTATIN 20 MG PO TABS: 20.0000 mg | ORAL_TABLET | Freq: Every day | ORAL | Status: DC

## 2012-05-23 MED ORDER — ZOLPIDEM TARTRATE 10 MG PO TABS: 10.0000 mg | ORAL_TABLET | Freq: Every evening | ORAL | Status: DC | PRN

## 2012-05-23 MED ORDER — PROPRANOLOL HCL 40 MG PO TABS: 40.0000 mg | ORAL_TABLET | Freq: Two times a day (BID) | ORAL | Status: DC

## 2012-05-23 NOTE — Assessment & Plan Note (Signed)
The patient has not been experiencing any palpitations or dizzy spells.  No headaches.

## 2012-05-23 NOTE — Patient Instructions (Addendum)
Your physician recommends that you continue on your current medications as directed. Please refer to the Current Medication list given to you today.  Your physician recommends that you schedule a follow-up appointment in: 4 months with fasting labs (lp/bmet/hfp)   Will obtain labs today and call you with the results (lp./bmet/hfp) 

## 2012-05-23 NOTE — Assessment & Plan Note (Signed)
The patient has not been experiencing any symptoms of congestive heart failure.  She's not having orthopnea or paroxysmal nocturnal dyspnea or any significant peripheral edema.

## 2012-05-23 NOTE — Progress Notes (Signed)
Quick Note:  Please report to patient. The recent labs are stable. Continue same medication and careful diet. The serum sodium is a little lower so I want her to increase her dietary salt slightly. Lipids remain good ______

## 2012-05-23 NOTE — Assessment & Plan Note (Signed)
The patient has not been experiencing any chest pain or angina. 

## 2012-05-23 NOTE — Telephone Encounter (Signed)
Advised patient of lab results  

## 2012-05-23 NOTE — Telephone Encounter (Signed)
Message copied by Burnell Blanks on Tue May 23, 2012  5:19 PM ------      Message from: Cassell Clement      Created: Tue May 23, 2012  2:20 PM       Please report to patient.  The recent labs are stable. Continue same medication and careful diet.  The serum sodium is a little lower so I want her to increase her dietary salt slightly.  Lipids remain good

## 2012-05-23 NOTE — Assessment & Plan Note (Signed)
The patient has a history of hypercholesterolemia.  She is on simvastatin 20 mg daily.  She is not having any myalgias 

## 2012-05-23 NOTE — Progress Notes (Signed)
Dalene Carrow Date of Birth:  [birth year] [facility] [address] [place], [place]  [address] [phone]         Fax   [phone]  History of Present Illness: This pleasant 78 year old woman is seen for a scheduled followup office visit. She has a past history of ischemic heart disease. She had a drug-eluting stent placed to her mid LAD in 2004. She has had no recurrent angina and she had a nuclear stress test on 09/13/07 showed no evidence of ischemia. The patient has a history of mild aortic insufficiency. Her last echocardiogram was in 01/27/12.  The echo showed an ejection fraction of 65-70% with mild aortic insufficiency and mild mitral regurgitation.  The patient had an electrocardiogram on 12/08/10 showing sinus bradycardia with first degree AV block He has not been experiencing any symptoms of congestive heart failure.  The patient has a history of insomnia and is doing well on generic Ambien 10 mg daily.   Current Outpatient Prescriptions  Medication Sig Dispense Refill  . amLODipine (NORVASC) 5 MG tablet Take 1 tablet (5 mg total) by mouth daily.  90 tablet  5  . aspirin 81 MG EC tablet Take 81 mg by mouth daily.        . hydrochlorothiazide (MICROZIDE) 12.5 MG capsule Take 1 capsule (12.5 mg total) by mouth daily.  90 capsule  3  . propranolol (INDERAL) 40 MG tablet Take 1 tablet (40 mg total) by mouth 2 (two) times daily.  180 tablet  3  . simvastatin (ZOCOR) 20 MG tablet Take 1 tablet (20 mg total) by mouth at bedtime.  90 tablet  3  . zolpidem (AMBIEN) 10 MG tablet Take 1 tablet (10 mg total) by mouth at bedtime as needed for sleep.  90 tablet  1  . [DISCONTINUED] hydrochlorothiazide (MICROZIDE) 12.5 MG capsule Take 1 capsule (12.5 mg total) by mouth daily.  90 capsule  3  . [DISCONTINUED] propranolol (INDERAL) 40 MG tablet Take 1 tablet (40 mg total) by mouth 2 (two) times daily.  180 tablet  3  . [DISCONTINUED] simvastatin (ZOCOR) 20 MG tablet Take 1  tablet (20 mg total) by mouth at bedtime.  90 tablet  3  . [DISCONTINUED] zolpidem (AMBIEN) 10 MG tablet Take 1 tablet (10 mg total) by mouth at bedtime as needed for sleep.  90 tablet  1  . HYDROcodone-acetaminophen (NORCO) 5-325 MG per tablet Take 1 tablet by mouth every 6 (six) hours as needed.        Allergies  Allergen Reactions  . Morphine And Related   . Prednisone     Patient Active Problem List  Diagnosis  . Hyperlipidemia  . Benign hypertensive heart disease without heart failure  . Ischemic heart disease  . Varicose veins  . Insomnia  . Aortic insufficiency    History  Smoking status  . Never Smoker   Smokeless tobacco  . Not on file    History  Alcohol Use No    Family History  Problem Relation Age of Onset  . Heart disease Father   . Arthritis Father   . Cancer Sister     colon  . Hypertension Mother   . Emphysema Brother     Review of Systems: Constitutional: no fever chills diaphoresis or fatigue or change in weight.  Head and neck: no hearing loss, no epistaxis, no photophobia or visual disturbance. Respiratory: No cough, shortness of breath or wheezing. Cardiovascular: No chest pain peripheral edema,  palpitations. Gastrointestinal: No abdominal distention, no abdominal pain, no change in bowel habits hematochezia or melena. Genitourinary: No dysuria, no frequency, no urgency, no nocturia. Musculoskeletal:No arthralgias, no back pain, no gait disturbance or myalgias. Neurological: No dizziness, no headaches, no numbness, no seizures, no syncope, no weakness, no tremors. Hematologic: No lymphadenopathy, no easy bruising. Psychiatric: No confusion, no hallucinations, no sleep disturbance.    Physical Exam: Filed Vitals:   05/23/12 0937  BP: 132/70  Pulse: 51  Resp: 19   the general appearance reveals an elderly woman in no acute distress.The head and neck exam reveals pupils equal and reactive.  Extraocular movements are full.  There is no  scleral icterus.  The mouth and pharynx are normal.  The neck is supple.  The carotids reveal no bruits.  The jugular venous pressure is normal.  The  thyroid is not enlarged.  There is no lymphadenopathy.  The chest is clear to percussion and auscultation.  There are no rales or rhonchi.  Expansion of the chest is symmetrical.  The precordium is quiet.  The first heart sound is normal.  The second heart sound is physiologically split.  There is a soft systolic ejection murmur at the base.  No gallop or rub.  There is no abnormal lift or heave.  The abdomen is soft and nontender.  The bowel sounds are normal.  The liver and spleen are not enlarged.  There are no abdominal masses.  There are no abdominal bruits.  Extremities reveal good pedal pulses.  There is no phlebitis or edema.  There is no cyanosis or clubbing.  Strength is normal and symmetrical in all extremities.  There is no lateralizing weakness.  There are no sensory deficits.  The skin is warm and dry.  There is no rash.     Assessment / Plan: Continue same medication.  We refilled some of her medications today.  Recheck in 4 months for followup office visit lipid panel basal metabolic panel and hepatic function panel.

## 2012-09-22 ENCOUNTER — Ambulatory Visit (INDEPENDENT_AMBULATORY_CARE_PROVIDER_SITE_OTHER): Payer: Medicare Other | Admitting: Cardiology

## 2012-09-22 ENCOUNTER — Encounter: Payer: Self-pay | Admitting: Cardiology

## 2012-09-22 ENCOUNTER — Other Ambulatory Visit (INDEPENDENT_AMBULATORY_CARE_PROVIDER_SITE_OTHER): Payer: Medicare Other

## 2012-09-22 VITALS — BP 132/76 | HR 53 | Ht 64.5 in | Wt 167.1 lb

## 2012-09-22 DIAGNOSIS — I359 Nonrheumatic aortic valve disorder, unspecified: Secondary | ICD-10-CM

## 2012-09-22 DIAGNOSIS — E7800 Pure hypercholesterolemia, unspecified: Secondary | ICD-10-CM

## 2012-09-22 DIAGNOSIS — E785 Hyperlipidemia, unspecified: Secondary | ICD-10-CM

## 2012-09-22 DIAGNOSIS — I119 Hypertensive heart disease without heart failure: Secondary | ICD-10-CM

## 2012-09-22 DIAGNOSIS — I351 Nonrheumatic aortic (valve) insufficiency: Secondary | ICD-10-CM

## 2012-09-22 LAB — HEPATIC FUNCTION PANEL
ALT: 20 U/L (ref 0–35)
AST: 21 U/L (ref 0–37)
Albumin: 4.2 g/dL (ref 3.5–5.2)
Alkaline Phosphatase: 70 U/L (ref 39–117)

## 2012-09-22 LAB — LIPID PANEL
Cholesterol: 142 mg/dL (ref 0–200)
HDL: 61.9 mg/dL (ref >39.00)
LDL Cholesterol: 64 mg/dL (ref 0–99)
Triglycerides: 83 mg/dL (ref 0.0–149.0)
VLDL: 16.6 mg/dL (ref 0.0–40.0)

## 2012-09-22 LAB — BASIC METABOLIC PANEL
CO2: 30 mEq/L (ref 19–32)
Calcium: 9.4 mg/dL (ref 8.4–10.5)
Creatinine, Ser: 0.7 mg/dL (ref 0.4–1.2)

## 2012-09-22 NOTE — Patient Instructions (Addendum)

## 2012-09-22 NOTE — Assessment & Plan Note (Signed)
Blood pressure has been remaining stable on current medication.  No palpitations dizziness or syncope

## 2012-09-22 NOTE — Assessment & Plan Note (Signed)
Patient has a history of hyperlipidemia.  She is tolerating simvastatin 20 mg daily

## 2012-09-22 NOTE — Progress Notes (Signed)
Susan Clark Date of Birth:  11/17/1928 Susan Clark [address] [place], [place]  [address] [phone]         Fax   [phone]  History of Present Illness:  This pleasant 79 year old woman is seen for a scheduled followup office visit. She has a past history of ischemic heart disease. She had a drug-eluting stent placed to her mid LAD in 2004. She has had no recurrent angina and she had a nuclear stress test on 09/13/07 showed no evidence of ischemia. The patient has a history of mild aortic insufficiency. Her last echocardiogram was in 01/27/12. The echo showed an ejection fraction of 65-70% with mild aortic insufficiency and mild mitral regurgitation. The patient had an electrocardiogram on 12/08/10 showing sinus bradycardia with first degree AV block He has not been experiencing any symptoms of congestive heart failure. The patient has a history of insomnia and is doing well on generic Ambien 10 mg daily.  Since last visit she has been doing well.  Current Outpatient Prescriptions  Medication Sig Dispense Refill  . amLODipine (NORVASC) 5 MG tablet Take 1 tablet (5 mg total) by mouth daily.  90 tablet  5  . aspirin 81 MG EC tablet Take 81 mg by mouth daily.        . hydrochlorothiazide (MICROZIDE) 12.5 MG capsule Take 1 capsule (12.5 mg total) by mouth daily.  90 capsule  3  . HYDROcodone-acetaminophen (NORCO) 5-325 MG per tablet Take 1 tablet by mouth every 6 (six) hours as needed.      . propranolol (INDERAL) 40 MG tablet Take 1 tablet (40 mg total) by mouth 2 (two) times daily.  180 tablet  3  . simvastatin (ZOCOR) 20 MG tablet Take 1 tablet (20 mg total) by mouth at bedtime.  90 tablet  3  . zolpidem (AMBIEN) 10 MG tablet Take 1 tablet (10 mg total) by mouth at bedtime as needed for sleep.  90 tablet  1   No current facility-administered medications for this visit.    Allergies  Allergen Reactions  . Morphine And Related   . Prednisone     Patient  Active Problem List  Diagnosis  . Hyperlipidemia  . Benign hypertensive heart disease without heart failure  . Ischemic heart disease  . Varicose veins  . Insomnia  . Aortic insufficiency    History  Smoking status  . Never Smoker   Smokeless tobacco  . Not on file    History  Alcohol Use No    Family History  Problem Relation Age of Onset  . Heart disease Father   . Arthritis Father   . Cancer Sister     colon  . Hypertension Mother   . Emphysema Brother     Review of Systems: Constitutional: no fever chills diaphoresis or fatigue or change in weight.  Head and neck: no hearing loss, no epistaxis, no photophobia or visual disturbance. Respiratory: No cough, shortness of breath or wheezing. Cardiovascular: No chest pain peripheral edema, palpitations. Gastrointestinal: No abdominal distention, no abdominal pain, no change in bowel habits hematochezia or melena. Genitourinary: No dysuria, no frequency, no urgency, no nocturia. Musculoskeletal:No arthralgias, no back pain, no gait disturbance or myalgias. Neurological: No dizziness, no headaches, no numbness, no seizures, no syncope, no weakness, no tremors. Hematologic: No lymphadenopathy, no easy bruising. Psychiatric: No confusion, no hallucinations, no sleep disturbance.    Physical Exam: Filed Vitals:   09/22/12 0932  BP: 132/76  Pulse: 53  the general appearance reveals a well-developed well-nourished elderly woman in no distress.  Her weight is up 1 pound since last visit.The head and neck exam reveals pupils equal and reactive.  Extraocular movements are full.  There is no scleral icterus.  The mouth and pharynx are normal.  The neck is supple.  The carotids reveal no bruits.  The jugular venous pressure is normal.  The  thyroid is not enlarged.  There is no lymphadenopathy.  The chest is clear to percussion and auscultation.  There are no rales or rhonchi.  Expansion of the chest is symmetrical.  The  precordium is quiet.  The first heart sound is normal.  The second heart sound is physiologically split.  There is no murmur gallop rub or click.  There is no abnormal lift or heave.  The abdomen is soft and nontender.  The bowel sounds are normal.  The liver and spleen are not enlarged.  There are no abdominal masses.  There are no abdominal bruits.  Extremities reveal good pedal pulses.  There is no phlebitis or edema.  There is no cyanosis or clubbing.  Strength is normal and symmetrical in all extremities.  There is no lateralizing weakness.  There are no sensory deficits.  The skin is warm and dry.  There is no rash.     Assessment / Plan:  Continue same medication.  Blood work today pending.  Recheck in 4 months for office visit lipid panel hepatic function panel and basal metabolic panel.

## 2012-09-22 NOTE — Assessment & Plan Note (Signed)
The patient has not been experiencing any orthopnea or paroxysmal nocturnal dyspnea.  She has occasional exertional dyspnea.  She has occasional chest pain.  No peripheral edema

## 2012-09-23 NOTE — Progress Notes (Signed)
Quick Note:  Please report to patient. The recent labs are stable. Continue same medication and careful diet. ______ 

## 2012-09-25 ENCOUNTER — Telehealth: Payer: Self-pay | Admitting: *Deleted

## 2012-09-25 DIAGNOSIS — G4700 Insomnia, unspecified: Secondary | ICD-10-CM

## 2012-09-25 NOTE — Telephone Encounter (Signed)
Advised patient of lab results  

## 2012-09-25 NOTE — Telephone Encounter (Signed)
Message copied by Burnell Blanks on Mon Sep 25, 2012  9:18 AM ------      Message from: Cassell Clement      Created: Sat Sep 23, 2012  4:42 PM       Please report to patient.  The recent labs are stable. Continue same medication and careful diet. ------

## 2012-10-12 MED ORDER — ZOLPIDEM TARTRATE 10 MG PO TABS: 10.0000 mg | ORAL_TABLET | Freq: Every evening | ORAL | Status: DC | PRN

## 2012-11-09 ENCOUNTER — Other Ambulatory Visit: Payer: Self-pay | Admitting: *Deleted

## 2012-11-09 DIAGNOSIS — I119 Hypertensive heart disease without heart failure: Secondary | ICD-10-CM

## 2012-11-09 MED ORDER — AMLODIPINE BESYLATE 5 MG PO TABS: 5.0000 mg | ORAL_TABLET | Freq: Every day | ORAL | Status: DC

## 2013-01-16 ENCOUNTER — Ambulatory Visit (INDEPENDENT_AMBULATORY_CARE_PROVIDER_SITE_OTHER): Payer: Medicare Other | Admitting: Cardiology

## 2013-01-16 ENCOUNTER — Encounter: Payer: Self-pay | Admitting: Cardiology

## 2013-01-16 VITALS — BP 150/71 | HR 64 | Ht 64.5 in | Wt 166.0 lb

## 2013-01-16 DIAGNOSIS — I259 Chronic ischemic heart disease, unspecified: Secondary | ICD-10-CM

## 2013-01-16 DIAGNOSIS — I119 Hypertensive heart disease without heart failure: Secondary | ICD-10-CM

## 2013-01-16 DIAGNOSIS — E785 Hyperlipidemia, unspecified: Secondary | ICD-10-CM

## 2013-01-16 DIAGNOSIS — I8390 Asymptomatic varicose veins of unspecified lower extremity: Secondary | ICD-10-CM

## 2013-01-16 DIAGNOSIS — E7800 Pure hypercholesterolemia, unspecified: Secondary | ICD-10-CM

## 2013-01-16 LAB — HEPATIC FUNCTION PANEL
AST: 23 U/L (ref 0–37)
Albumin: 4.3 g/dL (ref 3.5–5.2)
Alkaline Phosphatase: 73 U/L (ref 39–117)
Total Bilirubin: 0.9 mg/dL (ref 0.3–1.2)

## 2013-01-16 LAB — LIPID PANEL
Total CHOL/HDL Ratio: 2
Triglycerides: 99 mg/dL (ref 0.0–149.0)

## 2013-01-16 LAB — BASIC METABOLIC PANEL
CO2: 32 mEq/L (ref 19–32)
Calcium: 9.7 mg/dL (ref 8.4–10.5)
Creatinine, Ser: 0.8 mg/dL (ref 0.4–1.2)
Glucose, Bld: 90 mg/dL (ref 70–99)

## 2013-01-16 MED ORDER — NITROGLYCERIN 0.4 MG SL SUBL: 0.4000 mg | SUBLINGUAL_TABLET | SUBLINGUAL | Status: DC | PRN

## 2013-01-16 NOTE — Assessment & Plan Note (Signed)
We will call in some sublingual nitroglycerin for her to have on hand.  If her chest discomfort increases in severity or frequency we will consider followup Myoview at some point.  Presently the patient does not appear to be too concerned about her mild occasional chest discomfort.

## 2013-01-16 NOTE — Patient Instructions (Addendum)
Will obtain labs today and call you with the results (lp/bmet/hfp)  Your physician recommends that you continue on your current medications as directed. Please refer to the Current Medication list given to you today.  Your physician wants you to follow-up in: 4 months with fasting labs (lp/bmet/hfp) You will receive a reminder letter in the mail two months in advance. If you don't receive a letter, please call our office to schedule the follow-up appointment.   The proper use and anticipated side effects of nitroglycerine has been carefully explained.  If a single episode of chest pain is not relieved by one tablet, the patient will try another within 5 minutes; and if this doesn't relieve the pain, the patient is instructed to call 911 for transportation to an emergency department.

## 2013-01-16 NOTE — Progress Notes (Signed)
Susan Clark Date of Birth:  Nov 22, 1928 [facility] [address] [place], [place]  [address] [phone]         Fax   [phone]  History of Present Illness: This pleasant 79 year old woman is seen for a scheduled followup office visit. She has a past history of ischemic heart disease. She had a drug-eluting stent placed to her mid LAD in 2004.  she had a nuclear stress test on 09/13/07 showed no evidence of ischemia. The patient has a history of mild aortic insufficiency. Her last echocardiogram was in 01/27/12. The echo showed an ejection fraction of 65-70% with mild aortic insufficiency and mild mitral regurgitation. The patient had an electrocardiogram on 12/08/10 showing sinus bradycardia with first degree AV block He has not been experiencing any symptoms of congestive heart failure. The patient has a history of insomnia and is doing well on generic Ambien 10 mg daily. Since last visit she has been doing well.  Since last visit she's had occasional mild chest discomfort.  She has not had any nitroglycerin on hand at home.   Current Outpatient Prescriptions  Medication Sig Dispense Refill  . amLODipine (NORVASC) 5 MG tablet Take 1 tablet (5 mg total) by mouth daily.  90 tablet  3  . aspirin 81 MG EC tablet Take 81 mg by mouth daily.        . hydrochlorothiazide (MICROZIDE) 12.5 MG capsule Take 1 capsule (12.5 mg total) by mouth daily.  90 capsule  3  . propranolol (INDERAL) 40 MG tablet Take 1 tablet (40 mg total) by mouth 2 (two) times daily.  180 tablet  3  . simvastatin (ZOCOR) 20 MG tablet Take 1 tablet (20 mg total) by mouth at bedtime.  90 tablet  3  . zolpidem (AMBIEN) 10 MG tablet Take 1 tablet (10 mg total) by mouth at bedtime as needed for sleep.  90 tablet  1  . nitroGLYCERIN (NITROSTAT) 0.4 MG SL tablet Place 1 tablet (0.4 mg total) under the tongue every 5 (five) minutes as needed for chest pain.  25 tablet  prn   No current facility-administered  medications for this visit.    Allergies  Allergen Reactions  . Morphine And Related   . Prednisone     Patient Active Problem List   Diagnosis Date Noted  . Aortic insufficiency 09/20/2011  . Insomnia 05/21/2011  . Hyperlipidemia 12/08/2010  . Benign hypertensive heart disease without heart failure 12/08/2010  . Ischemic heart disease 12/08/2010  . Varicose veins 12/08/2010    History  Smoking status  . Never Smoker   Smokeless tobacco  . Not on file    History  Alcohol Use No    Family History  Problem Relation Age of Onset  . Heart disease Father   . Arthritis Father   . Cancer Sister     colon  . Hypertension Mother   . Emphysema Brother     Review of Systems: Constitutional: no fever chills diaphoresis or fatigue or change in weight.  Head and neck: no hearing loss, no epistaxis, no photophobia or visual disturbance. Respiratory: No cough, shortness of breath or wheezing. Cardiovascular: No chest pain peripheral edema, palpitations. Gastrointestinal: No abdominal distention, no abdominal pain, no change in bowel habits hematochezia or melena. Genitourinary: No dysuria, no frequency, no urgency, no nocturia. Musculoskeletal:No arthralgias, no back pain, no gait disturbance or myalgias. Neurological: No dizziness, no headaches, no numbness, no seizures, no syncope, no weakness, no tremors. Hematologic:  No lymphadenopathy, no easy bruising. Psychiatric: No confusion, no hallucinations, no sleep disturbance.    Physical Exam: Filed Vitals:   01/16/13 0911  BP: 150/71  Pulse: 64   the general appearance reveals a well-developed elderly woman in no distress.The head and neck exam reveals pupils equal and reactive.  Extraocular movements are full.  There is no scleral icterus.  The mouth and pharynx are normal.  The neck is supple.  The carotids reveal no bruits.  The jugular venous pressure is normal.  The  thyroid is not enlarged.  There is no  lymphadenopathy.  The chest is clear to percussion and auscultation.  There are no rales or rhonchi.  Expansion of the chest is symmetrical.  The precordium is quiet.  The first heart sound is normal.  The second heart sound is physiologically split.  There is no murmur gallop rub or click.  There is no abnormal lift or heave.  The abdomen is soft and nontender.  The bowel sounds are normal.  The liver and spleen are not enlarged.  There are no abdominal masses.  There are no abdominal bruits.  Extremities reveal good pedal pulses.  There is no phlebitis or edema.  There is no cyanosis or clubbing.  Strength is normal and symmetrical in all extremities.  There is no lateralizing weakness.  There are no sensory deficits.  The skin is warm and dry.  There is no rash.     Assessment / Plan: Continue same medication.  Continue careful diet.  Her weight is down 1 pound.  We will call her in some sublingual nitroglycerin to have on hand.  She will return in 4 months for followup office visit lipid panel hepatic function panel and basal metabolic panel.

## 2013-01-16 NOTE — Addendum Note (Signed)
Addended by: Regis Bill B on: 01/16/2013 09:58 AM   Modules accepted: Orders

## 2013-01-16 NOTE — Progress Notes (Signed)
Quick Note:  Please report to patient. The recent labs are stable. Continue same medication and careful diet. ______ 

## 2013-01-16 NOTE — Addendum Note (Signed)
Addended by: Awilda Bill on: 01/16/2013 09:41 AM   Modules accepted: Orders

## 2013-01-16 NOTE — Assessment & Plan Note (Signed)
Patient has significant varicose veins especially around the left ankle.  She does wear support hose.

## 2013-01-16 NOTE — Assessment & Plan Note (Signed)
Patient has a history of hyperlipidemia and is on simvastatin.  No myalgias.  Blood work today pending

## 2013-01-18 ENCOUNTER — Telehealth: Payer: Self-pay | Admitting: *Deleted

## 2013-01-18 NOTE — Telephone Encounter (Signed)
Advised patient of lab results  

## 2013-01-18 NOTE — Telephone Encounter (Signed)
Message copied by Burnell Blanks on Thu Jan 18, 2013  1:28 PM ------      Message from: Cassell Clement      Created: Tue Jan 16, 2013  7:15 PM       Please report to patient.  The recent labs are stable. Continue same medication and careful diet. ------

## 2013-05-17 ENCOUNTER — Encounter: Payer: Self-pay | Admitting: Cardiology

## 2013-05-17 ENCOUNTER — Ambulatory Visit (INDEPENDENT_AMBULATORY_CARE_PROVIDER_SITE_OTHER): Payer: Medicare Other | Admitting: Cardiology

## 2013-05-17 VITALS — BP 148/72 | HR 49 | Ht 65.0 in | Wt 164.0 lb

## 2013-05-17 DIAGNOSIS — E7800 Pure hypercholesterolemia, unspecified: Secondary | ICD-10-CM

## 2013-05-17 DIAGNOSIS — I259 Chronic ischemic heart disease, unspecified: Secondary | ICD-10-CM

## 2013-05-17 DIAGNOSIS — I351 Nonrheumatic aortic (valve) insufficiency: Secondary | ICD-10-CM

## 2013-05-17 DIAGNOSIS — I119 Hypertensive heart disease without heart failure: Secondary | ICD-10-CM

## 2013-05-17 DIAGNOSIS — I359 Nonrheumatic aortic valve disorder, unspecified: Secondary | ICD-10-CM

## 2013-05-17 DIAGNOSIS — E785 Hyperlipidemia, unspecified: Secondary | ICD-10-CM

## 2013-05-17 LAB — HEPATIC FUNCTION PANEL
AST: 23 U/L (ref 0–37)
Albumin: 4.2 g/dL (ref 3.5–5.2)
Total Bilirubin: 1.2 mg/dL (ref 0.3–1.2)

## 2013-05-17 LAB — BASIC METABOLIC PANEL
BUN: 12 mg/dL (ref 6–23)
CO2: 31 mEq/L (ref 19–32)
Calcium: 9.5 mg/dL (ref 8.4–10.5)
Chloride: 91 mEq/L — ABNORMAL LOW (ref 96–112)
Glucose, Bld: 99 mg/dL (ref 70–99)
Potassium: 4.1 mEq/L (ref 3.5–5.1)
Sodium: 129 mEq/L — ABNORMAL LOW (ref 135–145)

## 2013-05-17 LAB — LIPID PANEL
HDL: 66.5 mg/dL (ref >39.00)
LDL Cholesterol: 67 mg/dL (ref 0–99)
Total CHOL/HDL Ratio: 2
Triglycerides: 78 mg/dL (ref 0.0–149.0)
VLDL: 15.6 mg/dL (ref 0.0–40.0)

## 2013-05-17 NOTE — Progress Notes (Signed)
Susan Clark Date of Birth:  12-Aug-1928 [facility] [address] [place], [place]  [address] [phone]         Fax   [phone]  History of Present Illness: This pleasant 79 year old woman is seen for a scheduled followup office visit. She has a past history of ischemic heart disease. She had a drug-eluting stent placed to her mid LAD in 2004.  she had a nuclear stress test on 09/13/07 showed no evidence of ischemia. The patient has a history of mild aortic insufficiency. Her last echocardiogram was in 01/27/12. The echo showed an ejection fraction of 65-70% with mild aortic insufficiency and mild mitral regurgitation. The patient had an electrocardiogram on 12/08/10 showing sinus bradycardia with first degree AV block He has not been experiencing any symptoms of congestive heart failure. The patient has a history of insomnia and is doing well on generic Ambien 10 mg daily. Since last visit she has been doing well.  Since last visit she's had occasional mild chest discomfort.  She has not had any nitroglycerin on hand at home.  Her sister had colon cancer and the patient is scheduled for a colonoscopy tomorrow with Dr.Mann   Current Outpatient Prescriptions  Medication Sig Dispense Refill  . amLODipine (NORVASC) 5 MG tablet Take 1 tablet (5 mg total) by mouth daily.  90 tablet  3  . aspirin 81 MG EC tablet Take 81 mg by mouth daily.        . hydrochlorothiazide (MICROZIDE) 12.5 MG capsule Take 1 capsule (12.5 mg total) by mouth daily.  90 capsule  3  . nitroGLYCERIN (NITROSTAT) 0.4 MG SL tablet Place 1 tablet (0.4 mg total) under the tongue every 5 (five) minutes as needed for chest pain.  25 tablet  prn  . propranolol (INDERAL) 40 MG tablet Take 1 tablet (40 mg total) by mouth 2 (two) times daily.  180 tablet  3  . simvastatin (ZOCOR) 20 MG tablet Take 1 tablet (20 mg total) by mouth at bedtime.  90 tablet  3  . zolpidem (AMBIEN) 10 MG tablet Take 1 tablet (10 mg  total) by mouth at bedtime as needed for sleep.  90 tablet  1   No current facility-administered medications for this visit.    Allergies  Allergen Reactions  . Morphine And Related   . Prednisone     Patient Active Problem List   Diagnosis Date Noted  . Aortic insufficiency 09/20/2011  . Insomnia 05/21/2011  . Hyperlipidemia 12/08/2010  . Benign hypertensive heart disease without heart failure 12/08/2010  . Ischemic heart disease 12/08/2010  . Varicose veins 12/08/2010    History  Smoking status  . Never Smoker   Smokeless tobacco  . Not on file    History  Alcohol Use No    Family History  Problem Relation Age of Onset  . Heart disease Father   . Arthritis Father   . Cancer Sister     colon  . Hypertension Mother   . Emphysema Brother     Review of Systems: Constitutional: no fever chills diaphoresis or fatigue or change in weight.  Head and neck: no hearing loss, no epistaxis, no photophobia or visual disturbance. Respiratory: No cough, shortness of breath or wheezing. Cardiovascular: No chest pain peripheral edema, palpitations. Gastrointestinal: No abdominal distention, no abdominal pain, no change in bowel habits hematochezia or melena. Genitourinary: No dysuria, no frequency, no urgency, no nocturia. Musculoskeletal:No arthralgias, no back pain, no gait disturbance or  myalgias. Neurological: No dizziness, no headaches, no numbness, no seizures, no syncope, no weakness, no tremors. Hematologic: No lymphadenopathy, no easy bruising. Psychiatric: No confusion, no hallucinations, no sleep disturbance.    Physical Exam: Filed Vitals:   05/17/13 0844  BP: 148/72  Pulse: 49   the general appearance reveals a well-developed elderly woman in no distress.The head and neck exam reveals pupils equal and reactive.  Extraocular movements are full.  There is no scleral icterus.  The mouth and pharynx are normal.  The neck is supple.  The carotids reveal no bruits.   The jugular venous pressure is normal.  The  thyroid is not enlarged.  There is no lymphadenopathy.  The chest is clear to percussion and auscultation.  There are no rales or rhonchi.  Expansion of the chest is symmetrical.  The precordium is quiet.  The first heart sound is normal.  The second heart sound is physiologically split.  There is no murmur gallop rub or click.  There is no abnormal lift or heave.  The abdomen is soft and nontender.  The bowel sounds are normal.  The liver and spleen are not enlarged.  There are no abdominal masses.  There are no abdominal bruits.  Extremities reveal good pedal pulses.  There is no phlebitis or edema.  There is no cyanosis or clubbing.  Strength is normal and symmetrical in all extremities.  There is no lateralizing weakness.  There are no sensory deficits.  The skin is warm and dry.  There is no rash.   EKG today shows sinus bradycardia with first degree AV block and is unchanged since 12/08/10  Assessment / Plan: Continue same medication.  Continue careful diet.  Her weight is down 2 pound.   She will return in 4 months for followup office visit lipid panel hepatic function panel and basal metabolic panel.

## 2013-05-17 NOTE — Assessment & Plan Note (Signed)
The patient has not been experiencing any chest pain or angina.  She has not had to take any nitroglycerin.

## 2013-05-17 NOTE — Patient Instructions (Signed)
Will obtain labs today and call you with the results (lp/bmet/hfp)  Your physician recommends that you continue on your current medications as directed. Please refer to the Current Medication list given to you today.  Your physician recommends that you schedule a follow-up appointment in: 4 months with fasting labs (lp/bmet/hfp)  

## 2013-05-17 NOTE — Assessment & Plan Note (Signed)
Blood pressure has been remaining stable at home.  No dizzy spells or syncope

## 2013-05-17 NOTE — Assessment & Plan Note (Signed)
The patient is not having any symptoms of CHF.  No orthopnea or paroxysmal nocturnal dyspnea or ankle edema.

## 2013-05-18 ENCOUNTER — Telehealth: Payer: Self-pay | Admitting: Nurse Practitioner

## 2013-05-18 DIAGNOSIS — G4700 Insomnia, unspecified: Secondary | ICD-10-CM

## 2013-05-18 MED ORDER — ZOLPIDEM TARTRATE 10 MG PO TABS: 10.0000 mg | ORAL_TABLET | Freq: Every evening | ORAL | Status: DC | PRN

## 2013-05-18 NOTE — Telephone Encounter (Signed)
Spoke with patient to review lab results.  Patient verbalized understanding to stop HCTZ and recheck BMET in 2 weeks.  Lab appointment scheduled for 12/3.  Patient also requested refill of Ambien which was sent.  I advised patient to call the office if she develops extremity edema while off the HCTZ so we can send a Rx for Lasix to her pharmacy.  Patient verbalized understanding.

## 2013-05-23 ENCOUNTER — Other Ambulatory Visit: Payer: Self-pay | Admitting: Orthopaedic Surgery

## 2013-05-23 DIAGNOSIS — M542 Cervicalgia: Secondary | ICD-10-CM

## 2013-05-23 DIAGNOSIS — M4320 Fusion of spine, site unspecified: Secondary | ICD-10-CM

## 2013-05-29 ENCOUNTER — Telehealth: Payer: Self-pay | Admitting: *Deleted

## 2013-05-29 NOTE — Telephone Encounter (Signed)
Called in refill for zolpidem, unsure if it went through for a phone in on 04/2013

## 2013-05-30 ENCOUNTER — Other Ambulatory Visit: Payer: Medicare Other

## 2013-06-04 ENCOUNTER — Other Ambulatory Visit (INDEPENDENT_AMBULATORY_CARE_PROVIDER_SITE_OTHER): Payer: Medicare Other

## 2013-06-04 DIAGNOSIS — E7800 Pure hypercholesterolemia, unspecified: Secondary | ICD-10-CM

## 2013-06-04 LAB — BASIC METABOLIC PANEL
BUN: 9 mg/dL (ref 6–23)
CO2: 29 mEq/L (ref 19–32)
Chloride: 96 mEq/L (ref 96–112)
Creatinine, Ser: 0.7 mg/dL (ref 0.4–1.2)
Glucose, Bld: 53 mg/dL — ABNORMAL LOW (ref 70–99)
Potassium: 3.8 mEq/L (ref 3.5–5.1)

## 2013-06-04 NOTE — Progress Notes (Signed)
Quick Note:  Please report to patient. The recent labs are stable. Continue same medication and careful diet. ______ 

## 2013-06-05 ENCOUNTER — Telehealth: Payer: Self-pay | Admitting: *Deleted

## 2013-06-05 ENCOUNTER — Ambulatory Visit
Admission: RE | Admit: 2013-06-05 | Discharge: 2013-06-05 | Disposition: A | Discharge status: Home / Self Care | Payer: Medicare Other | Source: Ambulatory Visit | Attending: Orthopaedic Surgery | Admitting: Orthopaedic Surgery

## 2013-06-05 DIAGNOSIS — M4320 Fusion of spine, site unspecified: Secondary | ICD-10-CM

## 2013-06-05 DIAGNOSIS — M542 Cervicalgia: Secondary | ICD-10-CM

## 2013-06-05 NOTE — Telephone Encounter (Signed)
Message copied by Burnell Blanks on Tue Jun 05, 2013  9:30 AM ------      Message from: Cassell Clement      Created: Mon Jun 04, 2013  9:06 PM       Please report to patient.  The recent labs are stable. Continue same medication and careful diet. ------

## 2013-06-05 NOTE — Telephone Encounter (Signed)
Advised patient of lab results  

## 2013-09-05 ENCOUNTER — Ambulatory Visit (INDEPENDENT_AMBULATORY_CARE_PROVIDER_SITE_OTHER): Payer: Managed Care, Other (non HMO) | Admitting: Cardiology

## 2013-09-05 ENCOUNTER — Encounter: Payer: Self-pay | Admitting: Cardiology

## 2013-09-05 VITALS — BP 159/79 | HR 52 | Ht 65.0 in | Wt 161.1 lb

## 2013-09-05 DIAGNOSIS — I359 Nonrheumatic aortic valve disorder, unspecified: Secondary | ICD-10-CM

## 2013-09-05 DIAGNOSIS — I2510 Atherosclerotic heart disease of native coronary artery without angina pectoris: Secondary | ICD-10-CM

## 2013-09-05 DIAGNOSIS — E785 Hyperlipidemia, unspecified: Secondary | ICD-10-CM

## 2013-09-05 DIAGNOSIS — I259 Chronic ischemic heart disease, unspecified: Secondary | ICD-10-CM

## 2013-09-05 DIAGNOSIS — E871 Hypo-osmolality and hyponatremia: Secondary | ICD-10-CM

## 2013-09-05 DIAGNOSIS — E7800 Pure hypercholesterolemia, unspecified: Secondary | ICD-10-CM

## 2013-09-05 DIAGNOSIS — I351 Nonrheumatic aortic (valve) insufficiency: Secondary | ICD-10-CM

## 2013-09-05 DIAGNOSIS — I119 Hypertensive heart disease without heart failure: Secondary | ICD-10-CM

## 2013-09-05 LAB — BASIC METABOLIC PANEL
BUN: 8 mg/dL (ref 6–23)
CHLORIDE: 96 meq/L (ref 96–112)
CO2: 28 meq/L (ref 19–32)
Calcium: 9.3 mg/dL (ref 8.4–10.5)
Creatinine, Ser: 0.7 mg/dL (ref 0.4–1.2)
GFR: 83.62 mL/min (ref >60.00)
GLUCOSE: 116 mg/dL — AB (ref 70–99)
Potassium: 3.7 mEq/L (ref 3.5–5.1)
Sodium: 131 mEq/L — ABNORMAL LOW (ref 135–145)

## 2013-09-05 MED ORDER — PROPRANOLOL HCL 40 MG PO TABS: 40.0000 mg | ORAL_TABLET | Freq: Two times a day (BID) | ORAL | Status: DC

## 2013-09-05 MED ORDER — SIMVASTATIN 20 MG PO TABS: 20.0000 mg | ORAL_TABLET | Freq: Every day | ORAL | Status: DC

## 2013-09-05 MED ORDER — NITROGLYCERIN 0.4 MG SL SUBL: 0.4000 mg | SUBLINGUAL_TABLET | SUBLINGUAL | Status: AC | PRN

## 2013-09-05 MED ORDER — AMLODIPINE BESYLATE 5 MG PO TABS: 5.0000 mg | ORAL_TABLET | Freq: Every day | ORAL | Status: DC

## 2013-09-05 NOTE — Progress Notes (Signed)
Thayer Ohm Date of Birth:  May 01, 1929 [place], [place]  [address] [phone]         Fax   [phone]  History of Present Illness: This pleasant 79 year old woman is seen for a scheduled followup office visit. She has a past history of ischemic heart disease. She had a drug-eluting stent placed to her mid LAD in 2004.  she had a nuclear stress test on 09/13/07 showed no evidence of ischemia. The patient has a history of mild aortic insufficiency. Her last echocardiogram was in 01/27/12. The echo showed an ejection fraction of 65-70% with mild aortic insufficiency and mild mitral regurgitation. The patient had an electrocardiogram on 12/08/10 showing sinus bradycardia with first degree AV block He has not been experiencing any symptoms of congestive heart failure. The patient has a history of insomnia and is doing well on generic Ambien 10 mg daily. Since last visit she has been doing well.  Since last visit she's had occasional mild chest discomfort.  She has not had any nitroglycerin on hand at home.  The patient had a colonoscopy scheduled for May 18, 2013 with Dr. Collene Mares.  However the patient states that she developed marked bradycardia with attempt at colonoscopy with heart rate dropping to the 30s and the procedure was aborted.  She did have a successful upper endoscopy prior to the colonoscopy.   Current Outpatient Prescriptions  Medication Sig Dispense Refill  . amLODipine (NORVASC) 5 MG tablet Take 1 tablet (5 mg total) by mouth daily.  90 tablet  3  . aspirin 81 MG EC tablet Take 81 mg by mouth daily.        Marland Kitchen HYDROcodone-acetaminophen (NORCO/VICODIN) 5-325 MG per tablet Take 1 tablet by mouth every 6 (six) hours as needed for moderate pain.      . nitroGLYCERIN (NITROSTAT) 0.4 MG SL tablet Place 1 tablet (0.4 mg total) under the tongue every 5 (five) minutes as needed for chest pain.  25 tablet  prn  . omeprazole (PRILOSEC) 40 MG capsule Take 40 mg  by mouth daily.      . Probiotic Product (PROBIOTIC DAILY PO) Take by mouth daily.      . propranolol (INDERAL) 40 MG tablet Take 1 tablet (40 mg total) by mouth 2 (two) times daily.  180 tablet  3  . simvastatin (ZOCOR) 20 MG tablet Take 1 tablet (20 mg total) by mouth at bedtime.  90 tablet  3  . zolpidem (AMBIEN) 10 MG tablet Take 1 tablet (10 mg total) by mouth at bedtime as needed for sleep.  90 tablet  1   No current facility-administered medications for this visit.    Allergies  Allergen Reactions  . Morphine And Related   . Prednisone     Patient Active Problem List   Diagnosis Date Noted  . Aortic insufficiency 09/20/2011  . Insomnia 05/21/2011  . Hyperlipidemia 12/08/2010  . Benign hypertensive heart disease without heart failure 12/08/2010  . Ischemic heart disease 12/08/2010  . Varicose veins 12/08/2010    History  Smoking status  . Never Smoker   Smokeless tobacco  . Not on file    History  Alcohol Use No    Family History  Problem Relation Age of Onset  . Heart disease Father   . Arthritis Father   . Cancer Sister     colon  . Hypertension Mother   . Emphysema Brother     Review of Systems: Constitutional: no  fever chills diaphoresis or fatigue or change in weight.  Head and neck: no hearing loss, no epistaxis, no photophobia or visual disturbance. Respiratory: No cough, shortness of breath or wheezing. Cardiovascular: No chest pain peripheral edema, palpitations. Gastrointestinal: No abdominal distention, no abdominal pain, no change in bowel habits hematochezia or melena. Genitourinary: No dysuria, no frequency, no urgency, no nocturia. Musculoskeletal:No arthralgias, no back pain, no gait disturbance or myalgias. Neurological: No dizziness, no headaches, no numbness, no seizures, no syncope, no weakness, no tremors. Hematologic: No lymphadenopathy, no easy bruising. Psychiatric: No confusion, no hallucinations, no sleep  disturbance.    Physical Exam: Filed Vitals:   09/05/13 1448  BP: 159/79  Pulse: 52   the general appearance reveals a well-developed elderly woman in no distress.The head and neck exam reveals pupils equal and reactive.  Extraocular movements are full.  There is no scleral icterus.  The mouth and pharynx are normal.  The neck is supple.  The carotids reveal no bruits.  The jugular venous pressure is normal.  The  thyroid is not enlarged.  There is no lymphadenopathy.  The chest is clear to percussion and auscultation.  There are no rales or rhonchi.  Expansion of the chest is symmetrical.  The precordium is quiet.  The first heart sound is normal.  The second heart sound is physiologically split.  There is no murmur gallop rub or click.  There is no abnormal lift or heave.  The abdomen is soft and nontender.  The bowel sounds are normal.  The liver and spleen are not enlarged.  There are no abdominal masses.  There are no abdominal bruits.  Extremities reveal good pedal pulses.  There is no phlebitis or edema.  There is no cyanosis or clubbing.  Strength is normal and symmetrical in all extremities.  There is no lateralizing weakness.  There are no sensory deficits.  The skin is warm and dry.  There is no rash.     Assessment / Plan: Continue same medication.  Continue careful diet.  Her weight is down 3 pound.   She will return in 4 months for followup office visit lipid panel hepatic function panel and basal metabolic panel. She has had a recent chest cold since last week and is presently on Mucinex.  She is also taking a probiotic for her GI tract.  Her upper endoscopy showed some inflammation.

## 2013-09-05 NOTE — Assessment & Plan Note (Signed)
Blood pressure has been remaining stable on current therapy.  No headaches dizzy spells or syncope

## 2013-09-05 NOTE — Assessment & Plan Note (Signed)
The patient has not been experiencing any recent chest pain or angina 

## 2013-09-05 NOTE — Progress Notes (Signed)
Quick Note:  Please report to patient. The recent labs are stable. Continue same medication and careful diet. Sodium slightly better off HCTZ ______

## 2013-09-05 NOTE — Patient Instructions (Signed)
Will obtain labs today and call you with the results (bmet)  Your physician recommends that you continue on your current medications as directed. Please refer to the Current Medication list given to you today.  Your physician wants you to follow-up in: 4 months with fasting labs (lp/bmet/hfp)  You will receive a reminder letter in the mail two months in advance. If you don't receive a letter, please call our office to schedule the follow-up appointment.

## 2013-09-05 NOTE — Assessment & Plan Note (Signed)
The patient had developed hyponatremia and her hydrochlorothiazide was stopped.  We are rechecking her basal metabolic panel today.  She has not been experiencing any edema since we stopped her hydrochlorothiazide

## 2013-09-06 ENCOUNTER — Telehealth: Payer: Self-pay | Admitting: *Deleted

## 2013-09-06 NOTE — Telephone Encounter (Signed)
Advised patient of lab results  

## 2013-09-06 NOTE — Telephone Encounter (Signed)
Message copied by Earvin Hansen on Thu Sep 06, 2013  9:28 AM ------      Message from: Darlin Coco      Created: Wed Sep 05, 2013  9:03 PM       Please report to patient.  The recent labs are stable. Continue same medication and careful diet. Sodium slightly better off HCTZ ------

## 2013-10-30 ENCOUNTER — Other Ambulatory Visit: Payer: Self-pay | Admitting: *Deleted

## 2013-10-30 DIAGNOSIS — G4700 Insomnia, unspecified: Secondary | ICD-10-CM

## 2013-10-30 MED ORDER — ZOLPIDEM TARTRATE 10 MG PO TABS: 10.0000 mg | ORAL_TABLET | Freq: Every evening | ORAL | Status: DC | PRN

## 2013-11-26 ENCOUNTER — Telehealth: Payer: Self-pay | Admitting: *Deleted

## 2013-11-26 NOTE — Telephone Encounter (Signed)
PA for zolpidem to [employer] through [email or web address].

## 2014-01-03 ENCOUNTER — Ambulatory Visit: Payer: Managed Care, Other (non HMO) | Admitting: Cardiology

## 2014-02-07 ENCOUNTER — Encounter: Payer: Self-pay | Admitting: Cardiology

## 2014-02-07 ENCOUNTER — Ambulatory Visit (INDEPENDENT_AMBULATORY_CARE_PROVIDER_SITE_OTHER): Payer: Managed Care, Other (non HMO) | Admitting: Cardiology

## 2014-02-07 VITALS — BP 148/60 | HR 50 | Ht 64.5 in | Wt 157.0 lb

## 2014-02-07 DIAGNOSIS — E871 Hypo-osmolality and hyponatremia: Secondary | ICD-10-CM

## 2014-02-07 DIAGNOSIS — I351 Nonrheumatic aortic (valve) insufficiency: Secondary | ICD-10-CM

## 2014-02-07 DIAGNOSIS — E785 Hyperlipidemia, unspecified: Secondary | ICD-10-CM

## 2014-02-07 DIAGNOSIS — I259 Chronic ischemic heart disease, unspecified: Secondary | ICD-10-CM

## 2014-02-07 DIAGNOSIS — I119 Hypertensive heart disease without heart failure: Secondary | ICD-10-CM

## 2014-02-07 DIAGNOSIS — I359 Nonrheumatic aortic valve disorder, unspecified: Secondary | ICD-10-CM

## 2014-02-07 LAB — BASIC METABOLIC PANEL
BUN: 12 mg/dL (ref 6–23)
CHLORIDE: 97 meq/L (ref 96–112)
CO2: 30 mEq/L (ref 19–32)
CREATININE: 0.8 mg/dL (ref 0.4–1.2)
Calcium: 9.4 mg/dL (ref 8.4–10.5)
GFR: 73.85 mL/min (ref >60.00)
Glucose, Bld: 85 mg/dL (ref 70–99)
POTASSIUM: 4.1 meq/L (ref 3.5–5.1)
SODIUM: 133 meq/L — AB (ref 135–145)

## 2014-02-07 LAB — HEPATIC FUNCTION PANEL
ALBUMIN: 4.1 g/dL (ref 3.5–5.2)
ALT: 13 U/L (ref 0–35)
AST: 22 U/L (ref 0–37)
Alkaline Phosphatase: 75 U/L (ref 39–117)
Bilirubin, Direct: 0.1 mg/dL (ref 0.0–0.3)
TOTAL PROTEIN: 7.4 g/dL (ref 6.0–8.3)
Total Bilirubin: 1 mg/dL (ref 0.2–1.2)

## 2014-02-07 LAB — LIPID PANEL
CHOL/HDL RATIO: 2
Cholesterol: 137 mg/dL (ref 0–200)
HDL: 56.6 mg/dL (ref >39.00)
LDL CALC: 66 mg/dL (ref 0–99)
NONHDL: 80.4
TRIGLYCERIDES: 73 mg/dL (ref 0.0–149.0)
VLDL: 14.6 mg/dL (ref 0.0–40.0)

## 2014-02-07 NOTE — Patient Instructions (Signed)
Will obtain labs today and call you with the results (lp/bmet/hfp)  Your physician recommends that you continue on your current medications as directed. Please refer to the Current Medication list given to you today.  Your physician recommends that you schedule a follow-up appointment in: 4 months with fasting labs (lp/bmet/hfp)  

## 2014-02-07 NOTE — Assessment & Plan Note (Signed)
The patient has not been having any symptoms of congestive heart failure or palpitations.  He has occasional brief chest pain not requiring nitroglycerin

## 2014-02-07 NOTE — Assessment & Plan Note (Signed)
The patient has a past history o hyponatremia which appeared to improve after we stopped her hydrochlorothiazide.  .her blood pressure has been remaining stable on current therapy f

## 2014-02-07 NOTE — Addendum Note (Signed)
Addended by: Alvina Filbert B on: 02/07/2014 11:43 AM   Modules accepted: Orders

## 2014-02-07 NOTE — Progress Notes (Signed)
Thayer Ohm Date of Birth:  June 10, 1929 [place] [address] [place], [place]  [address] [phone]        Fax   [phone]   History of Present Illness: This pleasant 80 year old woman is seen for a scheduled followup office visit. She has a past history of ischemic heart disease. She had a drug-eluting stent placed to her mid LAD in 2004. she had a nuclear stress test on 09/13/07 showed no evidence of ischemia. The patient has a history of mild aortic insufficiency. Her last echocardiogram was in 01/27/12. The echo showed an ejection fraction of 65-70% with mild aortic insufficiency and mild mitral regurgitation. The patient had an electrocardiogram on 12/08/10 showing sinus bradycardia with first degree AV block He has not been experiencing any symptoms of congestive heart failure. The patient has a history of insomnia and is doing well on generic Ambien 10 mg daily. Since last visit she has been doing well. Since last visit she's had occasional mild chest discomfort. She has not had any nitroglycerin on hand at home.    Current Outpatient Prescriptions  Medication Sig Dispense Refill  . amLODipine (NORVASC) 5 MG tablet Take 1 tablet (5 mg total) by mouth daily.  90 tablet  3  . aspirin 81 MG EC tablet Take 81 mg by mouth daily.        Marland Kitchen HYDROcodone-acetaminophen (NORCO/VICODIN) 5-325 MG per tablet Take 1 tablet by mouth every 6 (six) hours as needed for moderate pain.      . nitroGLYCERIN (NITROSTAT) 0.4 MG SL tablet Place 1 tablet (0.4 mg total) under the tongue every 5 (five) minutes as needed for chest pain.  25 tablet  prn  . omeprazole (PRILOSEC) 40 MG capsule Take 40 mg by mouth daily.      . Probiotic Product (PROBIOTIC DAILY PO) Take by mouth daily.      . propranolol (INDERAL) 40 MG tablet Take 1 tablet (40 mg total) by mouth 2 (two) times daily.  180 tablet  3  . simvastatin (ZOCOR) 20 MG tablet Take 1 tablet (20 mg total) by mouth at bedtime.  90  tablet  3  . zolpidem (AMBIEN) 10 MG tablet Take 1 tablet (10 mg total) by mouth at bedtime as needed for sleep.  90 tablet  1   No current facility-administered medications for this visit.    Allergies  Allergen Reactions  . Morphine And Related   . Prednisone     Patient Active Problem List   Diagnosis Date Noted  . Hyposmolality and/or hyponatremia 09/05/2013  . Aortic insufficiency 09/20/2011  . Insomnia 05/21/2011  . Hyperlipidemia 12/08/2010  . Benign hypertensive heart disease without heart failure 12/08/2010  . Ischemic heart disease 12/08/2010  . Varicose veins 12/08/2010    History  Smoking status  . Never Smoker   Smokeless tobacco  . Not on file    History  Alcohol Use No    Family History  Problem Relation Age of Onset  . Heart disease Father   . Arthritis Father   . Cancer Sister     colon  . Hypertension Mother   . Emphysema Brother     Review of Systems: Constitutional: no fever chills diaphoresis or fatigue or change in weight.  Head and neck: no hearing loss, no epistaxis, no photophobia or visual disturbance. Respiratory: No cough, shortness of breath or wheezing. Cardiovascular: No chest pain peripheral edema, palpitations. Gastrointestinal: No abdominal distention, no abdominal  pain, no change in bowel habits hematochezia or melena. Genitourinary: No dysuria, no frequency, no urgency, no nocturia. Musculoskeletal:No arthralgias, no back pain, no gait disturbance or myalgias. Neurological: No dizziness, no headaches, no numbness, no seizures, no syncope, no weakness, no tremors. Hematologic: No lymphadenopathy, no easy bruising. Psychiatric: No confusion, no hallucinations, no sleep disturbance.    Physical Exam: Filed Vitals:   02/07/14 0826  BP: 148/60  Pulse: 50   the general appearance is that of a well-developed well-nourished elderly woman in no distress.The head and neck exam reveals pupils equal and reactive.  Extraocular  movements are full.  There is no scleral icterus.  The mouth and pharynx are normal.  The neck is supple.  The carotids reveal no bruits.  The jugular venous pressure is normal.  The  thyroid is not enlarged.  There is no lymphadenopathy.  The chest is clear to percussion and auscultation.  There are no rales or rhonchi.  Expansion of the chest is symmetrical.  The precordium is quiet.  The first heart sound is normal.  The second heart sound is physiologically split.  There is grade 1/6 systolic ejection murmur across the aortic valve.  No diastolic murmur audible today.  There is no abnormal lift or heave.  The abdomen is soft and nontender.  The bowel sounds are normal.  The liver and spleen are not enlarged.  There are no abdominal masses.  There are no abdominal bruits.  Extremities reveal good pedal pulses.  There is no phlebitis or edema.  There is no cyanosis or clubbing.  Strength is normal and symmetrical in all extremities.  There is no lateralizing weakness.  There are no sensory deficits.  The skin is warm and dry.  There is no rash.     Assessment / Plan: 1.  Essential hypertension without heart failure 2. Hypercholesterolemia 3. mild aortic insufficiency 4. past history of hyponatremia felt to be secondary to hydrochlorothiazide 5. chronic insomnia, on ambien  Disposition: Continue current medication.  Lab work today pending.  Recheck in 4 months for office visit and fasting lab work

## 2014-02-07 NOTE — Assessment & Plan Note (Signed)
Patient has a history of hyperlipidemia and is on 20 mg of simvastatin.  Blood work today is pending.

## 2014-02-07 NOTE — Assessment & Plan Note (Signed)
No symptoms of heart failure from her mild valvular heart disease.

## 2014-02-08 NOTE — Progress Notes (Signed)
Quick Note:  Please report to patient. The recent labs are stable. Continue same medication and careful diet. ______ 

## 2014-04-12 ENCOUNTER — Other Ambulatory Visit: Payer: Self-pay | Admitting: *Deleted

## 2014-04-12 DIAGNOSIS — G4700 Insomnia, unspecified: Secondary | ICD-10-CM

## 2014-04-13 MED ORDER — ZOLPIDEM TARTRATE 10 MG PO TABS: 10.0000 mg | ORAL_TABLET | Freq: Every evening | ORAL | Status: DC | PRN

## 2014-06-10 ENCOUNTER — Other Ambulatory Visit (INDEPENDENT_AMBULATORY_CARE_PROVIDER_SITE_OTHER): Payer: Managed Care, Other (non HMO) | Admitting: *Deleted

## 2014-06-10 ENCOUNTER — Encounter: Payer: Self-pay | Admitting: Cardiology

## 2014-06-10 ENCOUNTER — Ambulatory Visit (INDEPENDENT_AMBULATORY_CARE_PROVIDER_SITE_OTHER): Payer: Managed Care, Other (non HMO) | Admitting: Cardiology

## 2014-06-10 DIAGNOSIS — E785 Hyperlipidemia, unspecified: Secondary | ICD-10-CM

## 2014-06-10 DIAGNOSIS — I351 Nonrheumatic aortic (valve) insufficiency: Secondary | ICD-10-CM

## 2014-06-10 DIAGNOSIS — I119 Hypertensive heart disease without heart failure: Secondary | ICD-10-CM

## 2014-06-10 DIAGNOSIS — I259 Chronic ischemic heart disease, unspecified: Secondary | ICD-10-CM

## 2014-06-10 DIAGNOSIS — G4700 Insomnia, unspecified: Secondary | ICD-10-CM

## 2014-06-10 LAB — BASIC METABOLIC PANEL
BUN: 9 mg/dL (ref 6–23)
CO2: 29 mEq/L (ref 19–32)
Calcium: 9.2 mg/dL (ref 8.4–10.5)
Chloride: 99 mEq/L (ref 96–112)
Creatinine, Ser: 0.7 mg/dL (ref 0.4–1.2)
GFR: 86.26 mL/min (ref >60.00)
GLUCOSE: 89 mg/dL (ref 70–99)
POTASSIUM: 3.9 meq/L (ref 3.5–5.1)
Sodium: 134 mEq/L — ABNORMAL LOW (ref 135–145)

## 2014-06-10 LAB — HEPATIC FUNCTION PANEL
ALT: 11 U/L (ref 0–35)
AST: 19 U/L (ref 0–37)
Albumin: 4 g/dL (ref 3.5–5.2)
Alkaline Phosphatase: 69 U/L (ref 39–117)
BILIRUBIN TOTAL: 0.7 mg/dL (ref 0.2–1.2)
Bilirubin, Direct: 0.1 mg/dL (ref 0.0–0.3)
Total Protein: 7.1 g/dL (ref 6.0–8.3)

## 2014-06-10 LAB — LIPID PANEL
Cholesterol: 137 mg/dL (ref 0–200)
HDL: 56.8 mg/dL (ref >39.00)
LDL Cholesterol: 64 mg/dL (ref 0–99)
NonHDL: 80.2
Total CHOL/HDL Ratio: 2
Triglycerides: 80 mg/dL (ref 0.0–149.0)
VLDL: 16 mg/dL (ref 0.0–40.0)

## 2014-06-10 MED ORDER — ZOLPIDEM TARTRATE 10 MG PO TABS: 10.0000 mg | ORAL_TABLET | Freq: Every evening | ORAL | Status: DC | PRN

## 2014-06-10 NOTE — Progress Notes (Signed)
Quick Note:  Please report to patient. The recent labs are stable. Continue same medication and careful diet. ______ 

## 2014-06-10 NOTE — Assessment & Plan Note (Signed)
Her blood pressure is stable on current medication.  No dizziness or syncope.  No symptoms of CHF.

## 2014-06-10 NOTE — Assessment & Plan Note (Signed)
The patient has a history of hyperlipidemia.  She is not having any myalgias from her low-dose simvastatin

## 2014-06-10 NOTE — Assessment & Plan Note (Signed)
She has very infrequent mild chest discomfort.  It does not last long enough to take a nitroglycerin.

## 2014-06-10 NOTE — Addendum Note (Signed)
Addended by: Alvina Filbert B on: 06/10/2014 09:14 AM   Modules accepted: Orders

## 2014-06-10 NOTE — Patient Instructions (Signed)
Your physician recommends that you continue on your current medications as directed. Please refer to the Current Medication list given to you today.  Your physician wants you to follow-up in: 4 months with fasting labs (lp/bmet/hfp) You will receive a reminder letter in the mail two months in advance. If you don't receive a letter, please call our office to schedule the follow-up appointment.  

## 2014-06-10 NOTE — Progress Notes (Signed)
Thayer Ohm Date of Birth:  1928-10-03 [place] [address] [place], [place]  [address] [phone]        Fax   [phone]   History of Present Illness: This pleasant 80 year old woman is seen for a scheduled followup office visit. She has a past history of ischemic heart disease. She had a drug-eluting stent placed to her mid LAD in 2004. she had a nuclear stress test on 09/13/07 showed no evidence of ischemia. The patient has a history of mild aortic insufficiency. Her last echocardiogram was in 01/27/12. The echo showed an ejection fraction of 65-70% with mild aortic insufficiency and mild mitral regurgitation. The patient had an electrocardiogram on 12/08/10 showing sinus bradycardia with first degree AV block He has not been experiencing any symptoms of congestive heart failure. The patient has a history of insomnia and is doing well on generic Ambien 10 mg daily. Since last visit she has been doing well. Since last visit she's had occasional mild chest discomfort.  She has not had to take any sublingual nitroglycerin. Her daughter who lives in [place] age 57 was diagnosed with multiple myeloma after suffering a spontaneous compression fractures.  She is being followed at [facility].   Current Outpatient Prescriptions  Medication Sig Dispense Refill  . amLODipine (NORVASC) 5 MG tablet Take 1 tablet (5 mg total) by mouth daily. 90 tablet 3  . aspirin 81 MG EC tablet Take 81 mg by mouth daily.      Marland Kitchen HYDROcodone-acetaminophen (NORCO/VICODIN) 5-325 MG per tablet Take 1 tablet by mouth every 6 (six) hours as needed for moderate pain.    . nitroGLYCERIN (NITROSTAT) 0.4 MG SL tablet Place 1 tablet (0.4 mg total) under the tongue every 5 (five) minutes as needed for chest pain. 25 tablet prn  . omeprazole (PRILOSEC) 40 MG capsule Take 40 mg by mouth daily.    . Probiotic Product (PROBIOTIC DAILY PO) Take by mouth daily.    . propranolol (INDERAL) 40 MG  tablet Take 1 tablet (40 mg total) by mouth 2 (two) times daily. 180 tablet 3  . simvastatin (ZOCOR) 20 MG tablet Take 1 tablet (20 mg total) by mouth at bedtime. 90 tablet 3  . zolpidem (AMBIEN) 10 MG tablet Take 1 tablet (10 mg total) by mouth at bedtime as needed for sleep. 90 tablet 1   No current facility-administered medications for this visit.    Allergies  Allergen Reactions  . Morphine And Related   . Prednisone     Patient Active Problem List   Diagnosis Date Noted  . Hyposmolality and/or hyponatremia 09/05/2013  . Aortic insufficiency 09/20/2011  . Insomnia 05/21/2011  . Hyperlipidemia 12/08/2010  . Benign hypertensive heart disease without heart failure 12/08/2010  . Ischemic heart disease 12/08/2010  . Varicose veins 12/08/2010    History  Smoking status  . Never Smoker   Smokeless tobacco  . Not on file    History  Alcohol Use No    Family History  Problem Relation Age of Onset  . Heart disease Father   . Arthritis Father   . Cancer Sister     colon  . Hypertension Mother   . Emphysema Brother     Review of Systems: Constitutional: no fever chills diaphoresis or fatigue or change in weight.  Head and neck: no hearing loss, no epistaxis, no photophobia or visual disturbance. Respiratory: No cough, shortness of breath or wheezing. Cardiovascular: No chest pain peripheral  edema, palpitations. Gastrointestinal: No abdominal distention, no abdominal pain, no change in bowel habits hematochezia or melena. Genitourinary: No dysuria, no frequency, no urgency, no nocturia. Musculoskeletal:No arthralgias, no back pain, no gait disturbance or myalgias. Neurological: No dizziness, no headaches, no numbness, no seizures, no syncope, no weakness, no tremors. Hematologic: No lymphadenopathy, no easy bruising. Psychiatric: No confusion, no hallucinations, no sleep disturbance.    Physical Exam: Filed Vitals:   06/10/14 0809  BP: 110/84  Pulse: 48   the  general appearance is that of a well-developed well-nourished elderly woman in no distress.The head and neck exam reveals pupils equal and reactive.  Extraocular movements are full.  There is no scleral icterus.  The mouth and pharynx are normal.  The neck is supple.  The carotids reveal no bruits.  The jugular venous pressure is normal.  The  thyroid is not enlarged.  There is no lymphadenopathy.  The chest is clear to percussion and auscultation.  There are no rales or rhonchi.  Expansion of the chest is symmetrical.  The precordium is quiet.  The first heart sound is normal.  The second heart sound is physiologically split.  There is grade 1/6 systolic ejection murmur across the aortic valve.  No diastolic murmur audible today.  There is no abnormal lift or heave.  The abdomen is soft and nontender.  The bowel sounds are normal.  The liver and spleen are not enlarged.  There are no abdominal masses.  There are no abdominal bruits.  Extremities reveal good pedal pulses.  There is no phlebitis or edema.  There is no cyanosis or clubbing.  Strength is normal and symmetrical in all extremities.  There is no lateralizing weakness.  There are no sensory deficits.  The skin is warm and dry.  There is no rash.     Assessment / Plan: 1.  Essential hypertension without heart failure 2. Hypercholesterolemia.  She has lost 2 pounds intentionally since last visit. 3. mild aortic insufficiency 4. past history of hyponatremia felt to be secondary to hydrochlorothiazide 5. chronic insomnia, on ambien  Disposition: Continue current medication.  Lab work today pending.  Recheck in 4 months for office visit and fasting lab work

## 2014-10-17 ENCOUNTER — Encounter: Payer: Self-pay | Admitting: Cardiology

## 2014-10-17 ENCOUNTER — Ambulatory Visit (INDEPENDENT_AMBULATORY_CARE_PROVIDER_SITE_OTHER): Payer: PPO | Admitting: Cardiology

## 2014-10-17 ENCOUNTER — Other Ambulatory Visit (INDEPENDENT_AMBULATORY_CARE_PROVIDER_SITE_OTHER): Payer: PPO | Admitting: *Deleted

## 2014-10-17 VITALS — BP 110/80 | HR 52 | Ht 64.0 in | Wt 157.8 lb

## 2014-10-17 DIAGNOSIS — E785 Hyperlipidemia, unspecified: Secondary | ICD-10-CM

## 2014-10-17 DIAGNOSIS — I119 Hypertensive heart disease without heart failure: Secondary | ICD-10-CM | POA: Diagnosis not present

## 2014-10-17 LAB — HEPATIC FUNCTION PANEL
ALBUMIN: 4.3 g/dL (ref 3.5–5.2)
ALT: 10 U/L (ref 0–35)
AST: 18 U/L (ref 0–37)
Alkaline Phosphatase: 76 U/L (ref 39–117)
Bilirubin, Direct: 0.2 mg/dL (ref 0.0–0.3)
TOTAL PROTEIN: 7.7 g/dL (ref 6.0–8.3)
Total Bilirubin: 0.8 mg/dL (ref 0.2–1.2)

## 2014-10-17 LAB — BASIC METABOLIC PANEL
BUN: 11 mg/dL (ref 6–23)
CALCIUM: 9.7 mg/dL (ref 8.4–10.5)
CO2: 31 mEq/L (ref 19–32)
CREATININE: 0.71 mg/dL (ref 0.40–1.20)
Chloride: 98 mEq/L (ref 96–112)
GFR: 83.39 mL/min (ref >60.00)
GLUCOSE: 89 mg/dL (ref 70–99)
POTASSIUM: 4.1 meq/L (ref 3.5–5.1)
Sodium: 134 mEq/L — ABNORMAL LOW (ref 135–145)

## 2014-10-17 LAB — LIPID PANEL
Cholesterol: 136 mg/dL (ref 0–200)
HDL: 64 mg/dL (ref >39.00)
LDL Cholesterol: 60 mg/dL (ref 0–99)
NONHDL: 72
Total CHOL/HDL Ratio: 2
Triglycerides: 58 mg/dL (ref 0.0–149.0)
VLDL: 11.6 mg/dL (ref 0.0–40.0)

## 2014-10-17 NOTE — Progress Notes (Signed)
Quick Note:  Please report to patient. The recent labs are stable. Continue same medication and careful diet. ______ 

## 2014-10-17 NOTE — Progress Notes (Signed)
Cardiology Office Note   Date:  10/17/2014   ID:  Susan Clark, DOB 1929-02-14, MRN [id number]  PCP:  Stephens Shire, MD  Cardiologist:   Darlin Coco, MD   No chief complaint on file.     History of Present Illness: Susan Clark is a 81 y.o. female who presents for a four-month follow-up visit  This pleasant 81 year old woman is seen for a scheduled followup office visit. She has a past history of ischemic heart disease. She had a drug-eluting stent placed to her mid LAD in 2004. she had a nuclear stress test on 09/13/07 showed no evidence of ischemia. The patient has a history of mild aortic insufficiency. Her last echocardiogram was in 01/27/12. The echo showed an ejection fraction of 65-70% with mild aortic insufficiency and mild mitral regurgitation. The patient had an electrocardiogram on 12/08/10 showing sinus bradycardia with first degree AV block He has not been experiencing any symptoms of congestive heart failure. The patient has a history of insomnia and is doing well on generic Ambien 10 mg daily.  She is having some difficulty getting the insurance company to pay for it.  We will try to intercede for her.  Since last visit she has been doing well. Since last visit she's had occasional mild chest discomfort. She has not had to take any sublingual nitroglycerin. Her daughter who lives in [place] age 69 was diagnosed with multiple myeloma after suffering a spontaneous compression fractures. She is being followed at [facility].  Daughter is being considered for a stem cell transplant after her next round of chemotherapy.  Past Medical History  Diagnosis Date  . Palpitations     occasional  . Chronic back pain     Past Surgical History  Procedure Laterality Date  . Tonsillectomy and adenoidectomy    . Partial hysterectomy  1971  . Anterior cervical discectomy  03/28/2006    C4-C5  . Cardiovascular stress test  09/13/2007    EF 74%, normal study      Current Outpatient Prescriptions  Medication Sig Dispense Refill  . amLODipine (NORVASC) 5 MG tablet Take 1 tablet (5 mg total) by mouth daily. 90 tablet 3  . aspirin 81 MG EC tablet Take 81 mg by mouth daily.      Marland Kitchen HYDROcodone-acetaminophen (NORCO/VICODIN) 5-325 MG per tablet Take 1 tablet by mouth every 6 (six) hours as needed for moderate pain.    . nitroGLYCERIN (NITROSTAT) 0.4 MG SL tablet Place 1 tablet (0.4 mg total) under the tongue every 5 (five) minutes as needed for chest pain. 25 tablet prn  . omeprazole (PRILOSEC) 40 MG capsule Take 40 mg by mouth daily.    . Probiotic Product (PROBIOTIC DAILY PO) Take by mouth daily.    . propranolol (INDERAL) 40 MG tablet Take 1 tablet (40 mg total) by mouth 2 (two) times daily. 180 tablet 3  . simvastatin (ZOCOR) 20 MG tablet Take 1 tablet (20 mg total) by mouth at bedtime. 90 tablet 3  . zolpidem (AMBIEN) 10 MG tablet Take 1 tablet (10 mg total) by mouth at bedtime as needed for sleep. 90 tablet 1   No current facility-administered medications for this visit.    Allergies:   Morphine and related and Prednisone    Social History:  The patient  reports that she has never smoked. She does not have any smokeless tobacco history on file. She reports that she does not drink alcohol.   Family History:  The  patient's family history includes Arthritis in her father; Bladder Cancer in her brother; Bone cancer in her brother; Cancer in her mother and sister; Colon cancer in her sister; Emphysema in her brother; Heart attack in her father; Heart disease in her father; Hypertension in her mother.    ROS:  Please see the history of present illness.   Otherwise, review of systems are positive for none.   All other systems are reviewed and negative.    PHYSICAL EXAM: VS:  BP 110/80 mmHg  Pulse 52  Ht 5' 4"  (1.626 m)  Wt 157 lb 12.8 oz (71.578 kg)  BMI 27.07 kg/m2 , BMI Body mass index is 27.07 kg/(m^2). GEN: Well nourished, well developed,  in no acute distress HEENT: normal Neck: no JVD, carotid bruits, or masses Cardiac: RRR; no murmurs, rubs, or gallops,no edema  Respiratory:  clear to auscultation bilaterally, normal work of breathing GI: soft, nontender, nondistended, + BS MS: no deformity or atrophy Skin: warm and dry, no rash Neuro:  Strength and sensation are intact Psych: euthymic mood, full affect   EKG:  EKG is not ordered today.    Recent Labs: 06/10/2014: ALT 11; BUN 9; Creatinine 0.7; Potassium 3.9; Sodium 134*    Lipid Panel    Component Value Date/Time   CHOL 137 06/10/2014 0744   TRIG 80.0 06/10/2014 0744   HDL 56.80 06/10/2014 0744   CHOLHDL 2 06/10/2014 0744   VLDL 16.0 06/10/2014 0744   LDLCALC 64 06/10/2014 0744      Wt Readings from Last 3 Encounters:  10/17/14 157 lb 12.8 oz (71.578 kg)  06/10/14 155 lb (70.308 kg)  02/07/14 157 lb (71.215 kg)        ASSESSMENT AND PLAN:  1. Essential hypertension without heart failure 2. Hypercholesterolemia. She has lost 2 pounds intentionally since last visit. 3. mild aortic insufficiency 4. past history of hyponatremia felt to be secondary to hydrochlorothiazide 5. chronic insomnia, on generic ambien.  She does not sleep well at all without taking it  Disposition: Continue current medication. Lab work today pending. Recheck in 4 months for office visit and fasting lab work   Current medicines are reviewed at length with the patient today.  The patient does not have concerns regarding medicines.  The following changes have been made:  no change  Labs/ tests ordered today include:  No orders of the defined types were placed in this encounter.       Signed, Darlin Coco, MD  10/17/2014 9:17 AM    [facility] [address], [place], [place]  [address] Phone: [phone]; Fax: [phone]

## 2014-10-17 NOTE — Patient Instructions (Signed)
Medication Instructions:  Your physician recommends that you continue on your current medications as directed. Please refer to the Current Medication list given to you today.  Labwork: Lp/bmet/hfp  Testing/Procedures: none  Follow-Up: Your physician recommends that you schedule a follow-up appointment in: 4 months with fasting labs (lp/bmet/hfp)

## 2014-10-30 ENCOUNTER — Other Ambulatory Visit: Payer: Self-pay

## 2014-10-30 DIAGNOSIS — I119 Hypertensive heart disease without heart failure: Secondary | ICD-10-CM

## 2014-10-30 DIAGNOSIS — E7800 Pure hypercholesterolemia, unspecified: Secondary | ICD-10-CM

## 2014-10-30 MED ORDER — AMLODIPINE BESYLATE 5 MG PO TABS: 5.0000 mg | ORAL_TABLET | Freq: Every day | ORAL | Status: AC

## 2014-10-30 MED ORDER — SIMVASTATIN 20 MG PO TABS: 20.0000 mg | ORAL_TABLET | Freq: Every day | ORAL | Status: AC

## 2014-10-30 MED ORDER — PROPRANOLOL HCL 40 MG PO TABS: 40.0000 mg | ORAL_TABLET | Freq: Two times a day (BID) | ORAL | Status: DC

## 2014-11-20 ENCOUNTER — Telehealth: Payer: Self-pay

## 2014-11-20 NOTE — Telephone Encounter (Signed)
Fax from [employer] approving Zolpidem 10mg  good thru 06/28/2015.

## 2014-12-20 ENCOUNTER — Other Ambulatory Visit: Payer: Self-pay | Admitting: *Deleted

## 2014-12-20 DIAGNOSIS — G4700 Insomnia, unspecified: Secondary | ICD-10-CM

## 2014-12-20 MED ORDER — ZOLPIDEM TARTRATE 10 MG PO TABS: 10.0000 mg | ORAL_TABLET | Freq: Every evening | ORAL | Status: DC | PRN

## 2015-02-18 ENCOUNTER — Other Ambulatory Visit (INDEPENDENT_AMBULATORY_CARE_PROVIDER_SITE_OTHER): Payer: PPO

## 2015-02-18 ENCOUNTER — Encounter: Payer: Self-pay | Admitting: Cardiology

## 2015-02-18 ENCOUNTER — Ambulatory Visit (INDEPENDENT_AMBULATORY_CARE_PROVIDER_SITE_OTHER): Payer: PPO | Admitting: Cardiology

## 2015-02-18 VITALS — BP 120/78 | Ht 65.0 in | Wt 157.0 lb

## 2015-02-18 DIAGNOSIS — G4700 Insomnia, unspecified: Secondary | ICD-10-CM

## 2015-02-18 DIAGNOSIS — E785 Hyperlipidemia, unspecified: Secondary | ICD-10-CM | POA: Diagnosis not present

## 2015-02-18 DIAGNOSIS — E871 Hypo-osmolality and hyponatremia: Secondary | ICD-10-CM | POA: Diagnosis not present

## 2015-02-18 DIAGNOSIS — I259 Chronic ischemic heart disease, unspecified: Secondary | ICD-10-CM

## 2015-02-18 LAB — BASIC METABOLIC PANEL
BUN: 8 mg/dL (ref 6–23)
CHLORIDE: 97 meq/L (ref 96–112)
CO2: 31 mEq/L (ref 19–32)
Calcium: 9.7 mg/dL (ref 8.4–10.5)
Creatinine, Ser: 0.66 mg/dL (ref 0.40–1.20)
GFR: 90.65 mL/min (ref >60.00)
Glucose, Bld: 86 mg/dL (ref 70–99)
Potassium: 4 mEq/L (ref 3.5–5.1)
Sodium: 135 mEq/L (ref 135–145)

## 2015-02-18 LAB — HEPATIC FUNCTION PANEL
ALBUMIN: 4.2 g/dL (ref 3.5–5.2)
ALT: 9 U/L (ref 0–35)
AST: 16 U/L (ref 0–37)
Alkaline Phosphatase: 70 U/L (ref 39–117)
BILIRUBIN DIRECT: 0.2 mg/dL (ref 0.0–0.3)
TOTAL PROTEIN: 7.6 g/dL (ref 6.0–8.3)
Total Bilirubin: 0.9 mg/dL (ref 0.2–1.2)

## 2015-02-18 LAB — LIPID PANEL
CHOL/HDL RATIO: 2
CHOLESTEROL: 139 mg/dL (ref 0–200)
HDL: 60.6 mg/dL (ref >39.00)
LDL Cholesterol: 65 mg/dL (ref 0–99)
NonHDL: 78.02
TRIGLYCERIDES: 64 mg/dL (ref 0.0–149.0)
VLDL: 12.8 mg/dL (ref 0.0–40.0)

## 2015-02-18 NOTE — Patient Instructions (Signed)
Medication Instructions:  Your physician recommends that you continue on your current medications as directed. Please refer to the Current Medication list given to you today.  Labwork: Lp/bmet/hfp  Testing/Procedures: none  Follow-Up: Your physician recommends that you schedule a follow-up appointment in: 4 months with fasting labs (lp/bmet/hfp)

## 2015-02-18 NOTE — Progress Notes (Signed)
Cardiology Office Note   Date:  02/18/2015   ID:  Susan Clark, DOB 20-Jun-1929, MRN [id number]  PCP:  Stephens Shire, MD  Cardiologist: Darlin Coco MD  No chief complaint on file.     History of Present Illness: Susan Clark is a 81 y.o. female who presents for scheduled follow-up visit . She has a past history of ischemic heart disease. She had a drug-eluting stent placed to her mid LAD in 2004. she had a nuclear stress test on 09/13/07 showed no evidence of ischemia. The patient has a history of mild aortic insufficiency. Her last echocardiogram was in 01/27/12. The echo showed an ejection fraction of 65-70% with mild aortic insufficiency and mild mitral regurgitation. The patient had an electrocardiogram on 12/08/10 showing sinus bradycardia with first degree AV block He has not been experiencing any symptoms of congestive heart failure. The patient has a history of insomnia and is doing well on generic Ambien 10 mg daily.Her  Her ddaughter who lives in [place] age 43 was diagnosed with multiple myeloma after suffering a spontaneous compression fractures. She is being followed at [facility]. Since we last saw the patient, the daughter has had a stem cell transplant and so far is doing well. The patient has a past history of GI difficulties.  She has done well on probiotic NG no longer has to take omeprazole.  Bowel habits are normal..   Past Medical History  Diagnosis Date  . Palpitations     occasional  . Chronic back pain     Past Surgical History  Procedure Laterality Date  . Tonsillectomy and adenoidectomy    . Partial hysterectomy  1971  . Anterior cervical discectomy  03/28/2006    C4-C5  . Cardiovascular stress test  09/13/2007    EF 74%, normal study     Current Outpatient Prescriptions  Medication Sig Dispense Refill  . amLODipine (NORVASC) 5 MG tablet Take 1 tablet (5 mg total) by mouth daily. 90 tablet 3  . aspirin 81 MG EC tablet Take 81  mg by mouth daily.      Marland Kitchen HYDROcodone-acetaminophen (NORCO/VICODIN) 5-325 MG per tablet Take 1 tablet by mouth every 6 (six) hours as needed for moderate pain.    . nitroGLYCERIN (NITROSTAT) 0.4 MG SL tablet Place 1 tablet (0.4 mg total) under the tongue every 5 (five) minutes as needed for chest pain. 25 tablet prn  . Probiotic Product (PROBIOTIC DAILY PO) Take by mouth daily.    . propranolol (INDERAL) 40 MG tablet Take 1 tablet (40 mg total) by mouth 2 (two) times daily. 180 tablet 3  . simvastatin (ZOCOR) 20 MG tablet Take 1 tablet (20 mg total) by mouth at bedtime. 90 tablet 3  . zolpidem (AMBIEN) 10 MG tablet Take 1 tablet (10 mg total) by mouth at bedtime as needed for sleep. 90 tablet 1   No current facility-administered medications for this visit.    Allergies:   Morphine and related and Prednisone    Social History:  The patient  reports that she has never smoked. She does not have any smokeless tobacco history on file. She reports that she does not drink alcohol.   Family History:  The patient's family history includes Arthritis in her father; Bladder Cancer in her brother; Bone cancer in her brother; Cancer in her mother and sister; Colon cancer in her sister; Emphysema in her brother; Heart attack in her father; Heart disease in her father; Hypertension in her  mother.    ROS:  Please see the history of present illness.   Otherwise, review of systems are positive for none.   All other systems are reviewed and negative.    PHYSICAL EXAM: VS:  BP 120/78 mmHg  Ht 5' 5"  (1.651 m)  Wt 157 lb (71.215 kg)  BMI 26.13 kg/m2 , BMI Body mass index is 26.13 kg/(m^2). GEN: Well nourished, well developed, in no acute distress HEENT: normal Neck: no JVD, carotid bruits, or masses Cardiac: RRR; no murmurs, rubs, or gallops,no edema  Respiratory:  clear to auscultation bilaterally, normal work of breathing GI: soft, nontender, nondistended, + BS MS: no deformity or atrophy Skin: warm and  dry, no rash.  She has prominent varicose veins on her left ankle.  Normally she wears support hose.  She has good pedal pulses and no significant edema. Neuro:  Strength and sensation are intact Psych: euthymic mood, full affect   EKG:  EKG is not ordered today.    Recent Labs: 10/17/2014: ALT 10; BUN 11; Creatinine, Ser 0.71; Potassium 4.1; Sodium 134*    Lipid Panel    Component Value Date/Time   CHOL 136 10/17/2014 0852   TRIG 58.0 10/17/2014 0852   HDL 64.00 10/17/2014 0852   CHOLHDL 2 10/17/2014 0852   VLDL 11.6 10/17/2014 0852   LDLCALC 60 10/17/2014 0852      Wt Readings from Last 3 Encounters:  02/18/15 157 lb (71.215 kg)  10/17/14 157 lb 12.8 oz (71.578 kg)  06/10/14 155 lb (70.308 kg)         ASSESSMENT AND PLAN:  1. Essential hypertension without heart failure 2. Hypercholesterolemia. Her weight is stable since last visit 3. mild aortic insufficiency 4. past history of hyponatremia felt to be secondary to hydrochlorothiazide 5. chronic insomnia, on generic ambien. She does not sleep well at all without taking it 6.  Varicose veins left ankle  Current medicines are reviewed at length with the patient today.  The patient does not have concerns regarding medicines.  The following changes have been made:  no change  Labs/ tests ordered today include:   Orders Placed This Encounter  Procedures  . Lipid panel  . Hepatic function panel  . Basic metabolic panel  . Lipid panel  . Hepatic function panel  . Basic metabolic panel    Disposition: The patient will continue same medication.  We are checking lab work today.  Recheck in 4 months for office visit lipid panel hepatic function panel and basal metabolic panel  Signed, Darlin Coco MD 02/18/2015 8:46 AM    [facility] [address], [place], [place]  [address] Phone: [phone]; Fax: [phone]

## 2015-02-19 NOTE — Progress Notes (Signed)
Quick Note:  Please report to patient. The recent labs are stable. Continue same medication and careful diet. ______ 

## 2015-03-25 ENCOUNTER — Other Ambulatory Visit: Payer: Self-pay

## 2015-03-25 ENCOUNTER — Other Ambulatory Visit: Payer: Self-pay | Admitting: *Deleted

## 2015-03-25 DIAGNOSIS — G4700 Insomnia, unspecified: Secondary | ICD-10-CM

## 2015-03-25 MED ORDER — ZOLPIDEM TARTRATE 10 MG PO TABS: 10.0000 mg | ORAL_TABLET | Freq: Every evening | ORAL | Status: DC | PRN

## 2015-03-25 NOTE — Telephone Encounter (Signed)
Darlin Coco, MD   Earvin Hansen (You)  (10:55 AM)    Faythe Ghee to refill  Garret Reddish, CMA   Darlin Coco, MD and Earvin Hansen (You)  (10:41 AM)   Zolpidem refill requested--[facility] (Routing comment)

## 2015-03-25 NOTE — Telephone Encounter (Signed)
Okay to refill? 

## 2015-03-25 NOTE — Telephone Encounter (Signed)
done

## 2015-07-02 ENCOUNTER — Encounter: Payer: Self-pay | Admitting: Cardiology

## 2015-07-02 ENCOUNTER — Ambulatory Visit (INDEPENDENT_AMBULATORY_CARE_PROVIDER_SITE_OTHER): Payer: PPO | Admitting: Cardiology

## 2015-07-02 ENCOUNTER — Other Ambulatory Visit (INDEPENDENT_AMBULATORY_CARE_PROVIDER_SITE_OTHER): Payer: PPO | Admitting: *Deleted

## 2015-07-02 VITALS — BP 120/76 | HR 52

## 2015-07-02 DIAGNOSIS — I351 Nonrheumatic aortic (valve) insufficiency: Secondary | ICD-10-CM | POA: Diagnosis not present

## 2015-07-02 DIAGNOSIS — E785 Hyperlipidemia, unspecified: Secondary | ICD-10-CM

## 2015-07-02 DIAGNOSIS — I259 Chronic ischemic heart disease, unspecified: Secondary | ICD-10-CM

## 2015-07-02 DIAGNOSIS — I119 Hypertensive heart disease without heart failure: Secondary | ICD-10-CM

## 2015-07-02 LAB — LIPID PANEL
CHOLESTEROL: 129 mg/dL (ref 125–200)
HDL: 60 mg/dL (ref >=46)
LDL Cholesterol: 55 mg/dL (ref <130)
Total CHOL/HDL Ratio: 2.2 Ratio (ref <=5.0)
Triglycerides: 72 mg/dL (ref <150)
VLDL: 14 mg/dL (ref <30)

## 2015-07-02 LAB — HEPATIC FUNCTION PANEL
ALT: 10 U/L (ref 6–29)
AST: 19 U/L (ref 10–35)
Albumin: 4.1 g/dL (ref 3.6–5.1)
Alkaline Phosphatase: 64 U/L (ref 33–130)
BILIRUBIN DIRECT: 0.1 mg/dL (ref <=0.2)
BILIRUBIN INDIRECT: 0.7 mg/dL (ref 0.2–1.2)
Total Bilirubin: 0.8 mg/dL (ref 0.2–1.2)
Total Protein: 7.4 g/dL (ref 6.1–8.1)

## 2015-07-02 LAB — BASIC METABOLIC PANEL
BUN: 13 mg/dL (ref 7–25)
CO2: 27 mmol/L (ref 20–31)
Calcium: 9.5 mg/dL (ref 8.6–10.4)
Chloride: 94 mmol/L — ABNORMAL LOW (ref 98–110)
Creat: 0.77 mg/dL (ref 0.60–0.88)
GLUCOSE: 89 mg/dL (ref 65–99)
POTASSIUM: 4 mmol/L (ref 3.5–5.3)
Sodium: 132 mmol/L — ABNORMAL LOW (ref 135–146)

## 2015-07-02 NOTE — Addendum Note (Signed)
Addended by: Eulis Foster on: 07/02/2015 08:17 AM   Modules accepted: Orders

## 2015-07-02 NOTE — Patient Instructions (Signed)
Medication Instructions:  Your physician recommends that you continue on your current medications as directed. Please refer to the Current Medication list given to you today.  Labwork: none  Testing/Procedures: none  Follow-Up: Your physician wants you to follow-up in: 4 month ov with Dr Vilinda Boehringer will receive a reminder letter in the mail two months in advance. If you don't receive a letter, please call our office to schedule the follow-up appointment.  If you need a refill on your cardiac medications before your next appointment, please call your pharmacy.

## 2015-07-02 NOTE — Addendum Note (Signed)
Addended by: Eulis Foster on: 07/02/2015 08:09 AM   Modules accepted: Orders

## 2015-07-02 NOTE — Progress Notes (Signed)
Quick Note:  Please report to patient. The recent labs are stable. Continue same medication and careful diet. Sodium is slightly low. She has lost weight. Increase dietary salt slightly. ______

## 2015-07-02 NOTE — Progress Notes (Signed)
Cardiology Office Note   Date:  07/02/2015   ID:  Susan Clark, DOB 1929-05-06, MRN [id number]  PCP:  Stephens Shire, MD  Cardiologist: Darlin Coco MD  Chief Complaint  Patient presents with  . routine follow up    benign hypertensive disease without heart failure       History of Present Illness: Susan Clark is a 82 y.o. female who presents for scheduled four-month office visit.  Susan Clark is a 82 y.o. female who presents for scheduled follow-up visit . She has a past history of ischemic heart disease. She had a drug-eluting stent placed to her mid LAD in 2004. she had a nuclear stress test on 09/13/07 showed no evidence of ischemia. The patient has a history of mild aortic insufficiency. Her last echocardiogram was in 01/27/12. The echo showed an ejection fraction of 65-70% with mild aortic insufficiency and mild mitral regurgitation. The patient had an electrocardiogram on 12/08/10 showing sinus bradycardia with first degree AV block He has not been experiencing any symptoms of congestive heart failure. The patient has a history of insomnia and is doing well on generic Ambien 10 mg daily.. The patient has a past history of GI difficulties. She has done well on probiotic NG no longer has to take omeprazole. Bowel habits are normal.. In the past she has been told that she has asymptomatic gallstones.  She has occasional episodes of burping but denies any symptoms of acid reflux  Past Medical History  Diagnosis Date  . Palpitations     occasional  . Chronic back pain     Past Surgical History  Procedure Laterality Date  . Tonsillectomy and adenoidectomy    . Partial hysterectomy  1971  . Anterior cervical discectomy  03/28/2006    C4-C5  . Cardiovascular stress test  09/13/2007    EF 74%, normal study     Current Outpatient Prescriptions  Medication Sig Dispense Refill  . amLODipine (NORVASC) 5 MG tablet Take 1 tablet (5 mg total) by mouth daily. 90  tablet 3  . aspirin 81 MG EC tablet Take 81 mg by mouth daily.      Marland Kitchen HYDROcodone-acetaminophen (NORCO/VICODIN) 5-325 MG per tablet Take 1 tablet by mouth every 6 (six) hours as needed for moderate pain.    . nitroGLYCERIN (NITROSTAT) 0.4 MG SL tablet Place 1 tablet (0.4 mg total) under the tongue every 5 (five) minutes as needed for chest pain. 25 tablet prn  . Probiotic Product (PROBIOTIC DAILY PO) Take by mouth daily.    . propranolol (INDERAL) 40 MG tablet Take 1 tablet (40 mg total) by mouth 2 (two) times daily. 180 tablet 3  . simvastatin (ZOCOR) 20 MG tablet Take 1 tablet (20 mg total) by mouth at bedtime. 90 tablet 3  . zolpidem (AMBIEN) 10 MG tablet Take 1 tablet (10 mg total) by mouth at bedtime as needed for sleep. 90 tablet 1   No current facility-administered medications for this visit.    Allergies:   Morphine and related and Prednisone    Social History:  The patient  reports that she has never smoked. She does not have any smokeless tobacco history on file. She reports that she does not drink alcohol.   Family History:  The patient's family history includes Arthritis in her father; Bladder Cancer in her brother; Bone cancer in her brother; Cancer in her mother and sister; Colon cancer in her sister; Emphysema in her brother; Heart attack in her  father; Heart disease in her father; Hypertension in her mother.    ROS:  Please see the history of present illness.   Otherwise, review of systems are positive for none.   All other systems are reviewed and negative.    PHYSICAL EXAM: VS:  BP 120/76 mmHg  Pulse 52 , BMI There is no weight on file to calculate BMI. GEN: Well nourished, well developed, in no acute distress HEENT: normal Neck: no JVD, carotid bruits, or masses Cardiac: RRR; no murmurs, rubs, or gallops,no edema  Respiratory:  clear to auscultation bilaterally, normal work of breathing GI: soft, nontender, nondistended, + BS MS: no deformity or atrophy Skin: warm  and dry, no rash Neuro:  Strength and sensation are intact Psych: euthymic mood, full affect   EKG:  EKG is ordered today. The ekg ordered today demonstrates sinus bradycardia at 50 bpm with first-degree AV block, left axis deviation.   Recent Labs: 02/18/2015: ALT 9; BUN 8; Creatinine, Ser 0.66; Potassium 4.0; Sodium 135    Lipid Panel    Component Value Date/Time   CHOL 139 02/18/2015 0848   TRIG 64.0 02/18/2015 0848   HDL 60.60 02/18/2015 0848   CHOLHDL 2 02/18/2015 0848   VLDL 12.8 02/18/2015 0848   LDLCALC 65 02/18/2015 0848      Wt Readings from Last 3 Encounters:  02/18/15 157 lb (71.215 kg)  10/17/14 157 lb 12.8 oz (71.578 kg)  06/10/14 155 lb (70.308 kg)         ASSESSMENT AND PLAN:  1. Essential hypertension without heart failure 2. Hypercholesterolemia. Her weight is stable since last visit 3. mild aortic insufficiency 4. past history of hyponatremia felt to be secondary to hydrochlorothiazide 5. chronic insomnia, on generic ambien. She does not sleep well at all without taking it 6. Varicose veins left ankle   Current medicines are reviewed at length with the patient today.  The patient does not have concerns regarding medicines.  The following changes have been made:  no change  Labs/ tests ordered today include:  No orders of the defined types were placed in this encounter.    Disposition: The patient is to continue current medication.  If she begins to have more symptoms from her gallbladder she will contact her PCP Dr. Tollie Pizza.  Following my retirement she will be rechecked in 4 months with Dr.Nahser.  Berna Spare MD 07/02/2015 8:45 AM    [address], [place], [place]  [address] Phone: [phone]; Fax: [phone]

## 2015-07-08 ENCOUNTER — Telehealth: Payer: Self-pay | Admitting: Cardiology

## 2015-07-08 NOTE — Telephone Encounter (Signed)
New message  ° ° ° °Pt is calling for lab results °

## 2015-07-08 NOTE — Telephone Encounter (Signed)
Advised patient of lab results  

## 2015-07-08 NOTE — Telephone Encounter (Signed)
-----   Message from Darlin Coco, MD sent at 07/02/2015  9:04 PM EST ----- Please report to patient.  The recent labs are stable. Continue same medication and careful diet. Sodium is slightly low. She has lost weight. Increase dietary salt slightly.

## 2015-07-25 ENCOUNTER — Other Ambulatory Visit: Payer: Self-pay | Admitting: *Deleted

## 2015-07-25 DIAGNOSIS — G4700 Insomnia, unspecified: Secondary | ICD-10-CM

## 2015-07-31 MED ORDER — ZOLPIDEM TARTRATE 10 MG PO TABS: 10.0000 mg | ORAL_TABLET | Freq: Every evening | ORAL | Status: AC | PRN

## 2015-08-18 ENCOUNTER — Other Ambulatory Visit: Payer: Self-pay

## 2015-08-18 MED ORDER — PROPRANOLOL HCL 40 MG PO TABS: 40.0000 mg | ORAL_TABLET | Freq: Two times a day (BID) | ORAL | Status: AC

## 2015-09-04 ENCOUNTER — Emergency Department (HOSPITAL_COMMUNITY): Payer: PPO

## 2015-09-04 ENCOUNTER — Inpatient Hospital Stay (HOSPITAL_COMMUNITY)
Admission: EM | Admit: 2015-09-04 | Discharge: 2015-09-08 | DRG: 054 | Disposition: A | Discharge status: Home / Self Care | Payer: PPO | Attending: Internal Medicine | Admitting: Internal Medicine

## 2015-09-04 ENCOUNTER — Encounter (HOSPITAL_COMMUNITY): Payer: Self-pay | Admitting: Neurological Surgery

## 2015-09-04 ENCOUNTER — Inpatient Hospital Stay (HOSPITAL_COMMUNITY): Payer: PPO

## 2015-09-04 DIAGNOSIS — Z7982 Long term (current) use of aspirin: Secondary | ICD-10-CM

## 2015-09-04 DIAGNOSIS — G4700 Insomnia, unspecified: Secondary | ICD-10-CM | POA: Diagnosis present

## 2015-09-04 DIAGNOSIS — D72829 Elevated white blood cell count, unspecified: Secondary | ICD-10-CM | POA: Diagnosis present

## 2015-09-04 DIAGNOSIS — G935 Compression of brain: Secondary | ICD-10-CM | POA: Diagnosis present

## 2015-09-04 DIAGNOSIS — G8929 Other chronic pain: Secondary | ICD-10-CM | POA: Diagnosis present

## 2015-09-04 DIAGNOSIS — E785 Hyperlipidemia, unspecified: Secondary | ICD-10-CM | POA: Diagnosis present

## 2015-09-04 DIAGNOSIS — I080 Rheumatic disorders of both mitral and aortic valves: Secondary | ICD-10-CM | POA: Diagnosis present

## 2015-09-04 DIAGNOSIS — R4182 Altered mental status, unspecified: Secondary | ICD-10-CM | POA: Diagnosis present

## 2015-09-04 DIAGNOSIS — R40241 Glasgow coma scale score 13-15, unspecified time: Secondary | ICD-10-CM | POA: Diagnosis present

## 2015-09-04 DIAGNOSIS — I480 Paroxysmal atrial fibrillation: Secondary | ICD-10-CM | POA: Diagnosis not present

## 2015-09-04 DIAGNOSIS — C801 Malignant (primary) neoplasm, unspecified: Secondary | ICD-10-CM

## 2015-09-04 DIAGNOSIS — M549 Dorsalgia, unspecified: Secondary | ICD-10-CM | POA: Diagnosis present

## 2015-09-04 DIAGNOSIS — I4891 Unspecified atrial fibrillation: Secondary | ICD-10-CM | POA: Diagnosis not present

## 2015-09-04 DIAGNOSIS — I259 Chronic ischemic heart disease, unspecified: Secondary | ICD-10-CM | POA: Diagnosis not present

## 2015-09-04 DIAGNOSIS — T380X5A Adverse effect of glucocorticoids and synthetic analogues, initial encounter: Secondary | ICD-10-CM | POA: Diagnosis present

## 2015-09-04 DIAGNOSIS — C7801 Secondary malignant neoplasm of right lung: Secondary | ICD-10-CM | POA: Diagnosis present

## 2015-09-04 DIAGNOSIS — Z85820 Personal history of malignant melanoma of skin: Secondary | ICD-10-CM | POA: Diagnosis not present

## 2015-09-04 DIAGNOSIS — I2510 Atherosclerotic heart disease of native coronary artery without angina pectoris: Secondary | ICD-10-CM | POA: Diagnosis present

## 2015-09-04 DIAGNOSIS — Z8673 Personal history of transient ischemic attack (TIA), and cerebral infarction without residual deficits: Secondary | ICD-10-CM | POA: Diagnosis not present

## 2015-09-04 DIAGNOSIS — G939 Disorder of brain, unspecified: Secondary | ICD-10-CM | POA: Diagnosis not present

## 2015-09-04 DIAGNOSIS — G936 Cerebral edema: Secondary | ICD-10-CM | POA: Diagnosis present

## 2015-09-04 DIAGNOSIS — I119 Hypertensive heart disease without heart failure: Secondary | ICD-10-CM | POA: Diagnosis present

## 2015-09-04 DIAGNOSIS — C7931 Secondary malignant neoplasm of brain: Secondary | ICD-10-CM | POA: Diagnosis present

## 2015-09-04 DIAGNOSIS — R935 Abnormal findings on diagnostic imaging of other abdominal regions, including retroperitoneum: Secondary | ICD-10-CM | POA: Diagnosis not present

## 2015-09-04 DIAGNOSIS — Z955 Presence of coronary angioplasty implant and graft: Secondary | ICD-10-CM | POA: Diagnosis not present

## 2015-09-04 DIAGNOSIS — C7802 Secondary malignant neoplasm of left lung: Secondary | ICD-10-CM | POA: Diagnosis present

## 2015-09-04 HISTORY — DX: Essential (primary) hypertension: I10

## 2015-09-04 LAB — URINALYSIS, ROUTINE W REFLEX MICROSCOPIC
Bilirubin Urine: NEGATIVE
Glucose, UA: NEGATIVE mg/dL
Hgb urine dipstick: NEGATIVE
KETONES UR: NEGATIVE mg/dL
NITRITE: NEGATIVE
PH: 7 (ref 5.0–8.0)
PROTEIN: NEGATIVE mg/dL
Specific Gravity, Urine: 1.012 (ref 1.005–1.030)

## 2015-09-04 LAB — COMPREHENSIVE METABOLIC PANEL
ALBUMIN: 3.6 g/dL (ref 3.5–5.0)
ALT: 12 U/L — ABNORMAL LOW (ref 14–54)
ANION GAP: 13 (ref 5–15)
AST: 19 U/L (ref 15–41)
Alkaline Phosphatase: 60 U/L (ref 38–126)
BILIRUBIN TOTAL: 0.8 mg/dL (ref 0.3–1.2)
BUN: 6 mg/dL (ref 6–20)
CO2: 27 mmol/L (ref 22–32)
Calcium: 9.3 mg/dL (ref 8.9–10.3)
Chloride: 91 mmol/L — ABNORMAL LOW (ref 101–111)
Creatinine, Ser: 0.58 mg/dL (ref 0.44–1.00)
GFR calc Af Amer: >60 mL/min (ref >60)
GFR calc non Af Amer: >60 mL/min (ref >60)
GLUCOSE: 99 mg/dL (ref 65–99)
POTASSIUM: 3.9 mmol/L (ref 3.5–5.1)
SODIUM: 131 mmol/L — AB (ref 135–145)
TOTAL PROTEIN: 6.9 g/dL (ref 6.5–8.1)

## 2015-09-04 LAB — I-STAT CHEM 8, ED
BUN: 6 mg/dL (ref 6–20)
Calcium, Ion: 1.05 mmol/L — ABNORMAL LOW (ref 1.13–1.30)
Chloride: 88 mmol/L — ABNORMAL LOW (ref 101–111)
Creatinine, Ser: 0.5 mg/dL (ref 0.44–1.00)
Glucose, Bld: 101 mg/dL — ABNORMAL HIGH (ref 65–99)
HEMATOCRIT: 47 % — AB (ref 36.0–46.0)
Hemoglobin: 16 g/dL — ABNORMAL HIGH (ref 12.0–15.0)
Potassium: 3.6 mmol/L (ref 3.5–5.1)
SODIUM: 129 mmol/L — AB (ref 135–145)
TCO2: 28 mmol/L (ref 0–100)

## 2015-09-04 LAB — DIFFERENTIAL
BASOS ABS: 0 10*3/uL (ref 0.0–0.1)
BASOS PCT: 0 %
EOS PCT: 0 %
Eosinophils Absolute: 0 10*3/uL (ref 0.0–0.7)
LYMPHS ABS: 9.6 10*3/uL — AB (ref 0.7–4.0)
Lymphocytes Relative: 40 %
MONO ABS: 3.6 10*3/uL — AB (ref 0.1–1.0)
Monocytes Relative: 15 %
NEUTROS ABS: 10.8 10*3/uL — AB (ref 1.7–7.7)
Neutrophils Relative %: 45 %

## 2015-09-04 LAB — CBC
HEMATOCRIT: 44.5 % (ref 36.0–46.0)
Hemoglobin: 15.1 g/dL — ABNORMAL HIGH (ref 12.0–15.0)
MCH: 25.3 pg — AB (ref 26.0–34.0)
MCHC: 33.9 g/dL (ref 30.0–36.0)
MCV: 74.4 fL — AB (ref 78.0–100.0)
Platelets: 212 10*3/uL (ref 150–400)
RBC: 5.98 MIL/uL — ABNORMAL HIGH (ref 3.87–5.11)
RDW: 15.6 % — AB (ref 11.5–15.5)
WBC: 24 10*3/uL — ABNORMAL HIGH (ref 4.0–10.5)

## 2015-09-04 LAB — URINE MICROSCOPIC-ADD ON

## 2015-09-04 LAB — I-STAT TROPONIN, ED: Troponin i, poc: 0.01 ng/mL (ref 0.00–0.08)

## 2015-09-04 LAB — PROTIME-INR
INR: 1.11 (ref 0.00–1.49)
Prothrombin Time: 14.5 seconds (ref 11.6–15.2)

## 2015-09-04 LAB — APTT: APTT: 30 s (ref 24–37)

## 2015-09-04 MED ORDER — NITROGLYCERIN 0.4 MG SL SUBL: 0.4000 mg | SUBLINGUAL_TABLET | SUBLINGUAL | Status: DC | PRN

## 2015-09-04 MED ORDER — ACETAMINOPHEN 325 MG PO TABS
650.0000 mg | ORAL_TABLET | Freq: Four times a day (QID) | ORAL | Status: DC | PRN
  Administered 2015-09-05 – 2015-09-06 (×2): 650 mg via ORAL
  Filled 2015-09-04 (×2): qty 2

## 2015-09-04 MED ORDER — ONDANSETRON HCL 4 MG/2ML IJ SOLN: 4.0000 mg | Freq: Four times a day (QID) | INTRAMUSCULAR | Status: DC | PRN

## 2015-09-04 MED ORDER — GADOBENATE DIMEGLUMINE 529 MG/ML IV SOLN
15.0000 mL | Freq: Once | INTRAVENOUS | Status: AC | PRN
  Administered 2015-09-04: 12 mL via INTRAVENOUS

## 2015-09-04 MED ORDER — ALBUTEROL SULFATE (2.5 MG/3ML) 0.083% IN NEBU: 2.5000 mg | INHALATION_SOLUTION | RESPIRATORY_TRACT | Status: DC | PRN

## 2015-09-04 MED ORDER — SODIUM CHLORIDE 0.9 % IV SOLN
500.0000 mg | Freq: Two times a day (BID) | INTRAVENOUS | Status: DC
  Administered 2015-09-04 – 2015-09-05 (×2): 500 mg via INTRAVENOUS
  Filled 2015-09-04 (×4): qty 5

## 2015-09-04 MED ORDER — SODIUM CHLORIDE 0.9 % IV SOLN
INTRAVENOUS | Status: DC
  Administered 2015-09-04: via INTRAVENOUS

## 2015-09-04 MED ORDER — ONDANSETRON HCL 4 MG PO TABS: 4.0000 mg | ORAL_TABLET | Freq: Four times a day (QID) | ORAL | Status: DC | PRN

## 2015-09-04 MED ORDER — AMLODIPINE BESYLATE 5 MG PO TABS: 5.0000 mg | ORAL_TABLET | Freq: Every day | ORAL | Status: DC

## 2015-09-04 MED ORDER — ACETAMINOPHEN 650 MG RE SUPP: 650.0000 mg | Freq: Four times a day (QID) | RECTAL | Status: DC | PRN

## 2015-09-04 MED ORDER — DEXAMETHASONE SODIUM PHOSPHATE 10 MG/ML IJ SOLN
10.0000 mg | Freq: Once | INTRAMUSCULAR | Status: AC
  Administered 2015-09-04: 10 mg via INTRAVENOUS
  Filled 2015-09-04: qty 1

## 2015-09-04 MED ORDER — PROPRANOLOL HCL 20 MG PO TABS
40.0000 mg | ORAL_TABLET | Freq: Two times a day (BID) | ORAL | Status: DC
  Administered 2015-09-04: 40 mg via ORAL
  Filled 2015-09-04: qty 1

## 2015-09-04 MED ORDER — DEXAMETHASONE SODIUM PHOSPHATE 4 MG/ML IJ SOLN
4.0000 mg | Freq: Four times a day (QID) | INTRAMUSCULAR | Status: DC
  Administered 2015-09-05 – 2015-09-08 (×14): 4 mg via INTRAVENOUS
  Filled 2015-09-04 (×14): qty 1

## 2015-09-04 MED ORDER — PANTOPRAZOLE SODIUM 40 MG PO TBEC
40.0000 mg | DELAYED_RELEASE_TABLET | Freq: Two times a day (BID) | ORAL | Status: DC
  Administered 2015-09-04 – 2015-09-08 (×8): 40 mg via ORAL
  Filled 2015-09-04 (×8): qty 1

## 2015-09-04 MED ORDER — SODIUM CHLORIDE 0.9 % IV SOLN
Freq: Once | INTRAVENOUS | Status: AC
  Administered 2015-09-04: 13:00:00 via INTRAVENOUS

## 2015-09-04 MED ORDER — SODIUM CHLORIDE 0.9% FLUSH
3.0000 mL | Freq: Two times a day (BID) | INTRAVENOUS | Status: DC
  Administered 2015-09-06 – 2015-09-08 (×5): 3 mL via INTRAVENOUS

## 2015-09-04 MED ORDER — LORAZEPAM 2 MG/ML IJ SOLN
0.5000 mg | Freq: Once | INTRAMUSCULAR | Status: AC
  Administered 2015-09-04: 0.5 mg via INTRAVENOUS
  Filled 2015-09-04: qty 1

## 2015-09-04 MED ORDER — SIMVASTATIN 20 MG PO TABS
20.0000 mg | ORAL_TABLET | Freq: Every day | ORAL | Status: DC
  Administered 2015-09-04 – 2015-09-07 (×4): 20 mg via ORAL
  Filled 2015-09-04 (×4): qty 1

## 2015-09-04 NOTE — Consult Note (Signed)
Reason for Consult:brain mass Referring Physician: EDP  Susan Clark is an 82 y.o. female.   HPI:  82 yo WF with h/o melanoma resection L neck 2 yrs ago per her report who presents to the ER with complaints of soowly progressive visual loss, difficulty with cognition, and right-sided weakness and difficulty with gait. No headaches. Chest x-ray showed multiple shadows within the lungs and CT scan showed significant vasogenic edema in the left parietal region consistent with brain mass. MRI has been ordered. She has a history of leukocytosis which has not been worked up to this point. No history of smoking. No other history of cancer. Daughter has melanoma.  Past Medical History  Diagnosis Date  . Palpitations     occasional  . Chronic back pain     Past Surgical History  Procedure Laterality Date  . Tonsillectomy and adenoidectomy    . Partial hysterectomy  1971  . Anterior cervical discectomy  03/28/2006    C4-C5  . Cardiovascular stress test  09/13/2007    EF 74%, normal study    Allergies  Allergen Reactions  . Buprenorphine Hcl Other (See Comments)    Make her see things that are not there and makes her nausea   . Morphine And Related     Make her see things that are not there and makes her nausea   . Prednisone Other (See Comments)    Made her hyper and could not sleep     Social History  Substance Use Topics  . Smoking status: Never Smoker   . Smokeless tobacco: Not on file  . Alcohol Use: No    Family History  Problem Relation Age of Onset  . Heart disease Father   . Arthritis Father   . Heart attack Father   . Cancer Sister     colon  . Colon cancer Sister   . Hypertension Mother   . Cancer Mother     lung cancer  . Emphysema Brother   . Bone cancer Brother   . Bladder Cancer Brother      Review of Systems  Positive ROS: neg  All other systems have been reviewed and were otherwise negative with the exception of those mentioned in the HPI and as  above.  Objective: Vital signs in last 24 hours: Temp:  [98.1 F (36.7 C)] 98.1 F (36.7 C) (03/09 1126) Pulse Rate:  [45-62] 62 (03/09 1439) Resp:  [15-22] 18 (03/09 1439) BP: (141-179)/(63-74) 141/72 mmHg (03/09 1439) SpO2:  [96 %-99 %] 96 % (03/09 1439) Weight:  [63.192 kg (139 lb 5 oz)] 63.192 kg (139 lb 5 oz) (03/09 1141)  General Appearance: Alert, cooperative, no distress, appears stated age Head: Normocephalic, without obvious abnormality, atraumatic Eyes: PERRL, conjunctiva/corneas clear, EOM's intact, right visual field cut   Throat: benign Neck: Supple, symmetrical, trachea midline Back: Symmetric, no curvature, ROM normal, no CVA tenderness Lungs:  respirations unlabored Heart: Regular rate and rhythm Abdomen: Soft   NEUROLOGIC:   Mental status: A&O to name and place but not age, no aphasia though occasional word finding difficulty, good attention span, Memory and fund of knowledge difficult to test but I suspect she has some recent memory issue Motor Exam - grossly normal, normal tone and bulk, no pronator drift, however she certainly favors the use of her left side over her right side Sensory Exam - grossly normal Reflexes: symmetric, no pathologic reflexes, No Hoffman's, No clonus Coordination - grossly normal Gait -not tested Balance -  not tested Cranial Nerves: I: smell Not tested  II: visual acuity  OS: na    OD: na  II: visual fields Full to confrontation  II: pupils Equal, round, reactive to light  III,VII: ptosis None  III,IV,VI: extraocular muscles  Full ROM  V: mastication Normal  V: facial light touch sensation  Normal  V,VII: corneal reflex  Present  VII: facial muscle function - upper  Normal  VII: facial muscle function - lower Normal  VIII: hearing Not tested  IX: soft palate elevation  Normal  IX,X: gag reflex Present  XI: trapezius strength  5/5  XI: sternocleidomastoid strength 5/5  XI: neck flexion strength  5/5  XII: tongue strength   Normal    Data Review Lab Results  Component Value Date   WBC 24.0* 09/04/2015   HGB 16.0* 09/04/2015   HCT 47.0* 09/04/2015   MCV 74.4* 09/04/2015   PLT 212 09/04/2015   Lab Results  Component Value Date   NA 129* 09/04/2015   K 3.6 09/04/2015   CL 88* 09/04/2015   CO2 27 09/04/2015   BUN 6 09/04/2015   CREATININE 0.50 09/04/2015   GLUCOSE 101* 09/04/2015   Lab Results  Component Value Date   INR 1.11 09/04/2015    Radiology: Dg Chest 2 View  09/04/2015  CLINICAL DATA:  Leukocytosis. Severe vasogenic edema in the left cerebral hemisphere. EXAM: CHEST  2 VIEW COMPARISON:  09/18/2007 FINDINGS: Scattered pulmonary nodules are present. A mass at the right lung base measures 3.1 cm in diameter. Appearance favoring pulmonary metastatic disease bilaterally. Atherosclerotic aortic arch. IMPRESSION: 1. Numerous scattered bilateral pulmonary nodules and a right basilar pulmonary mass. Appearance favors metastatic disease to both lungs, especially in light of the vasogenic edema in the brain. 2. Atherosclerotic aortic arch. Electronically Signed   By: Van Clines M.D.   On: 09/04/2015 15:22   Ct Head Wo Contrast  09/04/2015  CLINICAL DATA:  Frontal headache and weakness all over for several weeks. EXAM: CT HEAD WITHOUT CONTRAST TECHNIQUE: Contiguous axial images were obtained from the base of the skull through the vertex without intravenous contrast. COMPARISON:  04/02/2006 FINDINGS: Left vertebral artery atherosclerotic calcification. Prominent sulcal effacement in the left cerebral hemisphere with extensive white matter hypodensity favoring vasogenic edema primarily in the left temporal, occipital, and parietal lobes. Effacement of the left lateral ventricle 7 mm of left-to-right midline shift. I do not observe definite associated hematoma nor is there an obvious abnormality in the splenium of the corpus callosum or tracking cross to the contralateral side. There is some mild crowding  along the ambient cisterns. The white matter hypodensity tracks into the posterior limb of the left internal capsule and posteriorly into the left external capsule. No definite abnormal extra-axial fluid collection is identified. No overt trapping of the right lateral ventricle at this time. IMPRESSION: 1. Severe vasogenic edema in the left temporal, parietal, and occipital lobes associated with sulcal effacement in the left cerebral hemisphere, 7 mm of left-to-right midline shift, and some effacement of the ambient cistern and left lateral ventricle. This is a nonspecific but severe appearance which could reflect underlying tumor, encephalitis, or other severe inflammatory process. Pattern less likely to be due to stroke. When feasible, brain MRI with and without contrast is recommended for further assessment and characterization, and urgent neurologist consultation is recommended. Critical Value/emergent results were called by telephone at the time of interpretation on 09/04/2015 at 12:32 pm to Dr. Julianne Rice, who verbally acknowledged these results.  Electronically Signed   By: Van Clines M.D.   On: 09/04/2015 12:34     Assessment/Plan: 82 year old female with multiple nodules within the lungs on chest x-ray and a significant amount of vasogenic edema in the left parietal lobe with probable underlying mass though no contrast was given, history of melanoma.  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 I explained to her that we will know more once the workup is completed.   Demaya Hardge S 09/04/2015 5:21 PM

## 2015-09-04 NOTE — H&P (Signed)
Patient Demographics  Susan Clark, is a 82 y.o. female  MRN: [id number]   DOB - 1929-01-17  Admit Date - 09/04/2015  Outpatient Primary MD for the patient is Stephens Shire, MD   With History of -  Past Medical History  Diagnosis Date  . Palpitations     occasional  . Chronic back pain       Past Surgical History  Procedure Laterality Date  . Tonsillectomy and adenoidectomy    . Partial hysterectomy  1971  . Anterior cervical discectomy  03/28/2006    C4-C5  . Cardiovascular stress test  09/13/2007    EF 74%, normal study    in for   Chief Complaint  Patient presents with  . Cerebrovascular Accident     HPI  Susan Clark  is a 82 y.o. female, With history of hypertension, coronary artery disease, hyperlipidemia, presents with complaints of generalized weakness, weight loss, and fatigue over last few weeks, with significant worsening of her coordination over last 24 hours, husband and son-in-law give history, report patient lost 20 pounds over last 2 month, been frail and weak, and they noticed fine monitor for coordination worsening over last 24 hours, in ER  patient was afebrile, workup significant for leukocytosis of WBC 24K, husband report this was find by PCP during last visit, was trying to arrange for hematology follow-up, CT head significant for left-sided vasogenic edema, with effacement of sulci, with 7 mm midline shift, ED discussed with neurosurgery, who recommended Decadron and MRI brain, as well as a discussed with neurology Dr. Nicole Kindred, who did not see an indication for hypertonic saline at this point, I was called to admit.  Review of Systems    In addition to the HPI above,  No Fever-chills, No Headache, No changes with Vision or hearing, No problems swallowing food or Liquids, No Chest pain, Cough or Shortness of Breath, No Abdominal pain, No Nausea or Vommitting, Bowel movements are regular, No Blood in stool or Urine, No dysuria, No new skin  rashes or bruises, No new joints pains-aches,  They report generalized weakness. No recent weight gain or loss, No polyuria, polydypsia or polyphagia, No significant Mental Stressors, reports memory loss and confusion.  A full 10 point Review of Systems was done, except as stated above, all other Review of Systems were negative.   Social History Social History  Substance Use Topics  . Smoking status: Never Smoker   . Smokeless tobacco: Not on file  . Alcohol Use: No     Family History Family History  Problem Relation Age of Onset  . Heart disease Father   . Arthritis Father   . Heart attack Father   . Cancer Sister     colon  . Colon cancer Sister   . Hypertension Mother   . Cancer Mother     lung cancer  . Emphysema Brother   . Bone cancer Brother   . Bladder Cancer Brother      Prior to Admission medications   Medication Sig Start Date End Date Taking? Authorizing Provider  amLODipine (NORVASC) 5 MG tablet Take 1 tablet (5 mg total) by mouth daily. 10/30/14  Yes Darlin Coco, MD  aspirin 81 MG EC tablet Take 81 mg by mouth daily.     Yes Historical Provider, MD  HYDROcodone-acetaminophen (NORCO/VICODIN) 5-325 MG per tablet Take 1 tablet by mouth every 6 (six) hours as needed for moderate pain.   Yes Historical Provider, MD  Probiotic Product (  PROBIOTIC DAILY PO) Take by mouth daily.   Yes Historical Provider, MD  propranolol (INDERAL) 40 MG tablet Take 1 tablet (40 mg total) by mouth 2 (two) times daily. 08/18/15  Yes Darlin Coco, MD  simvastatin (ZOCOR) 20 MG tablet Take 1 tablet (20 mg total) by mouth at bedtime. 10/30/14  Yes Darlin Coco, MD  zolpidem (AMBIEN) 10 MG tablet Take 1 tablet (10 mg total) by mouth at bedtime as needed for sleep. 07/31/15  Yes Darlin Coco, MD  nitroGLYCERIN (NITROSTAT) 0.4 MG SL tablet Place 1 tablet (0.4 mg total) under the tongue every 5 (five) minutes as needed for chest pain. 09/05/13   Darlin Coco, MD    Allergies    Allergen Reactions  . Buprenorphine Hcl Other (See Comments)    Make her see things that are not there and makes her nausea   . Morphine And Related     Make her see things that are not there and makes her nausea   . Prednisone Other (See Comments)    Made her hyper and could not sleep     Physical Exam  Vitals  Blood pressure 168/67, pulse 58, temperature 98.1 F (36.7 C), temperature source Oral, resp. rate 19, height 5\' 5"  (1.651 m), weight 63.192 kg (139 lb 5 oz), SpO2 98 %.   1. General Frail elderly female lying in bed in NAD,    2. Normal affect and insight, Not Suicidal or Homicidal, Awake Alert, Oriented X 2.  3. No F.N deficits, ALL C.Nerves Intact, Strength grossly intact in all extremities, + dysmetria on right side.  4. Ears and Eyes appear Normal, Conjunctivae clear, PERRLA. Moist Oral Mucosa.  5. Supple Neck, No JVD, No cervical lymphadenopathy appriciated, No Carotid Bruits.  6. Symmetrical Chest wall movement, Good air movement bilaterally, CTAB.  7. RRR, No Gallops, Rubs or Murmurs, No Parasternal Heave.  8. Positive Bowel Sounds, Abdomen Soft, No tenderness, No organomegaly appriciated,No rebound -guarding or rigidity.  9.  No Cyanosis, Normal Skin Turgor, No Skin Rash or Bruise.  10. Good muscle tone,  joints appear normal , no effusions, Normal ROM.   Data Review  CBC  Recent Labs Lab 09/04/15 1140 09/04/15 1145  WBC 24.0*  --   HGB 15.1* 16.0*  HCT 44.5 47.0*  PLT 212  --   MCV 74.4*  --   MCH 25.3*  --   MCHC 33.9  --   RDW 15.6*  --   LYMPHSABS 9.6*  --   MONOABS 3.6*  --   EOSABS 0.0  --   BASOSABS 0.0  --    ------------------------------------------------------------------------------------------------------------------  Chemistries   Recent Labs Lab 09/04/15 1140 09/04/15 1145  NA 131* 129*  K 3.9 3.6  CL 91* 88*  CO2 27  --   GLUCOSE 99 101*  BUN 6 6  CREATININE 0.58 0.50  CALCIUM 9.3  --   AST 19  --   ALT 12*   --   ALKPHOS 60  --   BILITOT 0.8  --    ------------------------------------------------------------------------------------------------------------------ estimated creatinine clearance is 47.1 mL/min (by C-G formula based on Cr of 0.5). ------------------------------------------------------------------------------------------------------------------ No results for input(s): TSH, T4TOTAL, T3FREE, THYROIDAB in the last 72 hours.  Invalid input(s): FREET3   Coagulation profile  Recent Labs Lab 09/04/15 1140  INR 1.11   ------------------------------------------------------------------------------------------------------------------- No results for input(s): DDIMER in the last 72 hours. -------------------------------------------------------------------------------------------------------------------  Cardiac Enzymes No results for input(s): CKMB, TROPONINI, MYOGLOBIN in the last 168 hours.  Invalid input(s): CK ------------------------------------------------------------------------------------------------------------------ Invalid input(s): POCBNP   ---------------------------------------------------------------------------------------------------------------  Urinalysis    Component Value Date/Time   COLORURINE YELLOW 09/04/2015 [address] 09/04/2015 1232   LABSPEC 1.012 09/04/2015 1232   PHURINE 7.0 09/04/2015 1232   GLUCOSEU NEGATIVE 09/04/2015 1232   HGBUR NEGATIVE 09/04/2015 1232   BILIRUBINUR NEGATIVE 09/04/2015 1232   KETONESUR NEGATIVE 09/04/2015 1232   PROTEINUR NEGATIVE 09/04/2015 1232   UROBILINOGEN 0.2 01/09/2007 0957   NITRITE NEGATIVE 09/04/2015 1232   LEUKOCYTESUR TRACE* 09/04/2015 1232    ----------------------------------------------------------------------------------------------------------------  Imaging results:   Ct Head Wo Contrast  09/04/2015  CLINICAL DATA:  Frontal headache and weakness all over for several weeks. EXAM:  CT HEAD WITHOUT CONTRAST TECHNIQUE: Contiguous axial images were obtained from the base of the skull through the vertex without intravenous contrast. COMPARISON:  04/02/2006 FINDINGS: Left vertebral artery atherosclerotic calcification. Prominent sulcal effacement in the left cerebral hemisphere with extensive white matter hypodensity favoring vasogenic edema primarily in the left temporal, occipital, and parietal lobes. Effacement of the left lateral ventricle 7 mm of left-to-right midline shift. I do not observe definite associated hematoma nor is there an obvious abnormality in the splenium of the corpus callosum or tracking cross to the contralateral side. There is some mild crowding along the ambient cisterns. The white matter hypodensity tracks into the posterior limb of the left internal capsule and posteriorly into the left external capsule. No definite abnormal extra-axial fluid collection is identified. No overt trapping of the right lateral ventricle at this time. IMPRESSION: 1. Severe vasogenic edema in the left temporal, parietal, and occipital lobes associated with sulcal effacement in the left cerebral hemisphere, 7 mm of left-to-right midline shift, and some effacement of the ambient cistern and left lateral ventricle. This is a nonspecific but severe appearance which could reflect underlying tumor, encephalitis, or other severe inflammatory process. Pattern less likely to be due to stroke. When feasible, brain MRI with and without contrast is recommended for further assessment and characterization, and urgent neurologist consultation is recommended. Critical Value/emergent results were called by telephone at the time of interpretation on 09/04/2015 at 12:32 pm to Dr. Julianne Rice, who verbally acknowledged these results. Electronically Signed   By: Van Clines M.D.   On: 09/04/2015 12:34       Assessment & Plan  Active Problems:   Ischemic heart disease   Vasogenic brain edema  (HCC)   Leucocytosis  Nonfocal neurological deficits secondary to vasogenic brain edema - Patient presents with poor coordination, physical exam nonfocal, CT head significant for left vasogenic edema with midline shift, ED discussed with neurosurgery (Dr. Ronnald Ramp )and neurology(Dr. Nicole Kindred), at this point no indication for hypertonic saline, continue with Decadron, will await MRI brain with and without contrast prior to further evaluation, as I could've a finding related to due to infectious versus tumor process. - We'll start on Decadron and Keppra for seizure prophylaxis - Continue with neuro checks every 12 hours.  Leukocytosis - Patient is afebrile, unclear etiology, negative urinalysis, will check 2 view chest x-ray and will check cultures, vision does not appear toxic, no indication for antibiotics.  Coronary artery disease - Denies any chest pain or shortness of breath, continue to hold aspirin for possible need for surgical procedure.  Hypertension - Uncontrolled, continue with home medication  Hyperlipidemia - Continue with statin   DVT Prophylaxis SCDs, hold chemical anticoagulation for possible need of intervention  AM Labs Ordered, also please review Full Orders  Family Communication: Admission,  patients condition and plan of care including tests being ordered have been discussed with the patient and husband who indicate understanding and agree with the plan and Code Status.  Code Status Full  Condition GUARDED    Time spent in minutes : 55 minutes    Dayln Tugwell M.D on 09/04/2015 at 2:34 PM  Between 7am to 7pm - Pager - [phone]  After 7pm go to [email or web address] - password [email or web address]  And look for the night coverage person covering me after hours  [facility] Office  [phone]

## 2015-09-04 NOTE — ED Provider Notes (Signed)
CSN: [id number]     Arrival date & time 09/04/15  1055 History   First MD Initiated Contact with Patient 09/04/15 1148     Chief Complaint  Patient presents with  . Cerebrovascular Accident   Patient is a 82 y.o. female presenting with altered mental status. The history is provided by the patient, the spouse and a relative. No language interpreter was used.  Altered Mental Status Presenting symptoms: confusion, disorientation and memory loss   Severity:  Moderate Most recent episode:  Today Episode history:  Continuous Duration:  3 months Timing:  Constant Progression:  Worsening Context: not dementia, not head injury, not a nursing home resident, not a recent change in medication, not a recent illness and not a recent infection   Associated symptoms: weakness   Associated symptoms: no abdominal pain, no agitation, no bladder incontinence, no depression, no fever, no headaches, no light-headedness, no nausea, no palpitations, no rash, no visual change and no vomiting     Past Medical History  Diagnosis Date  . Palpitations     occasional  . Chronic back pain    Past Surgical History  Procedure Laterality Date  . Tonsillectomy and adenoidectomy    . Partial hysterectomy  1971  . Anterior cervical discectomy  03/28/2006    C4-C5  . Cardiovascular stress test  09/13/2007    EF 74%, normal study   Family History  Problem Relation Age of Onset  . Heart disease Father   . Arthritis Father   . Heart attack Father   . Cancer Sister     colon  . Colon cancer Sister   . Hypertension Mother   . Cancer Mother     lung cancer  . Emphysema Brother   . Bone cancer Brother   . Bladder Cancer Brother    Social History  Substance Use Topics  . Smoking status: Never Smoker   . Smokeless tobacco: Not on file  . Alcohol Use: No   OB History    No data available     Review of Systems  Constitutional: Negative for fever, chills, activity change and appetite change.  HENT: Negative  for congestion, dental problem, ear pain, facial swelling, hearing loss, rhinorrhea, sneezing, sore throat, trouble swallowing and voice change.   Eyes: Negative for photophobia, pain, redness and visual disturbance.  Respiratory: Negative for apnea, cough, chest tightness, shortness of breath, wheezing and stridor.   Cardiovascular: Negative for chest pain, palpitations and leg swelling.  Gastrointestinal: Negative for nausea, vomiting, abdominal pain, diarrhea, constipation, blood in stool and abdominal distention.  Endocrine: Negative for polydipsia and polyuria.  Genitourinary: Negative for bladder incontinence, frequency, hematuria, flank pain, decreased urine volume and difficulty urinating.  Musculoskeletal: Negative for back pain, joint swelling, gait problem, neck pain and neck stiffness.  Skin: Negative for rash and wound.  Allergic/Immunologic: Negative for immunocompromised state.  Neurological: Positive for weakness. Negative for dizziness, syncope, facial asymmetry, speech difficulty, light-headedness, numbness and headaches.  Hematological: Negative for adenopathy.  Psychiatric/Behavioral: Positive for memory loss and confusion. Negative for suicidal ideas, behavioral problems, sleep disturbance and agitation. The patient is not nervous/anxious.   All other systems reviewed and are negative.     Allergies  Buprenorphine hcl; Morphine and related; and Prednisone  Home Medications   Prior to Admission medications   Medication Sig Start Date End Date Taking? Authorizing Provider  amLODipine (NORVASC) 5 MG tablet Take 1 tablet (5 mg total) by mouth daily. 10/30/14  Yes Darlin Coco, MD  aspirin 81 MG EC tablet Take 81 mg by mouth daily.     Yes Historical Provider, MD  HYDROcodone-acetaminophen (NORCO/VICODIN) 5-325 MG per tablet Take 1 tablet by mouth every 6 (six) hours as needed for moderate pain.   Yes Historical Provider, MD  Probiotic Product (PROBIOTIC DAILY PO) Take  by mouth daily.   Yes Historical Provider, MD  propranolol (INDERAL) 40 MG tablet Take 1 tablet (40 mg total) by mouth 2 (two) times daily. 08/18/15  Yes Darlin Coco, MD  simvastatin (ZOCOR) 20 MG tablet Take 1 tablet (20 mg total) by mouth at bedtime. 10/30/14  Yes Darlin Coco, MD  zolpidem (AMBIEN) 10 MG tablet Take 1 tablet (10 mg total) by mouth at bedtime as needed for sleep. 07/31/15  Yes Darlin Coco, MD  nitroGLYCERIN (NITROSTAT) 0.4 MG SL tablet Place 1 tablet (0.4 mg total) under the tongue every 5 (five) minutes as needed for chest pain. 09/05/13   Darlin Coco, MD   BP 141/72 mmHg  Pulse 62  Temp(Src) 98.1 F (36.7 C) (Oral)  Resp 18  Ht 5\' 5"  (1.651 m)  Wt 63.192 kg  BMI 23.18 kg/m2  SpO2 96% Physical Exam  Constitutional: She is oriented to person, place, and time. She appears well-developed and well-nourished. No distress.  HENT:  Head: Normocephalic and atraumatic.  Right Ear: External ear normal.  Left Ear: External ear normal.  Eyes: Pupils are equal, round, and reactive to light. Right eye exhibits no discharge. Left eye exhibits no discharge.  Neck: Normal range of motion. No JVD present. No tracheal deviation present.  Cardiovascular: Normal rate, regular rhythm and normal heart sounds.  Exam reveals no friction rub.   No murmur heard. Pulmonary/Chest: Effort normal and breath sounds normal. No stridor. No respiratory distress. She has no wheezes.  Abdominal: Soft. Bowel sounds are normal. She exhibits no distension. There is no rebound and no guarding.  Musculoskeletal: Normal range of motion. She exhibits no edema or tenderness.  Lymphadenopathy:    She has no cervical adenopathy.  Neurological: She is alert and oriented to person, place, and time. No cranial nerve deficit. Coordination normal. GCS eye subscore is 4. GCS verbal subscore is 5. GCS motor subscore is 6.  Patient with slowed mentation. 4+ strength in right upper and right lower tremors. 5  out of 5 strength on left. No cranial nerve deficits. Dysmetria on right greater than left.    Skin: Skin is warm and dry. No rash noted. No pallor.  Psychiatric: She has a normal mood and affect. Her behavior is normal. Judgment and thought content normal.  Nursing note and vitals reviewed.   ED Course  Procedures (including critical care time) Labs Review Labs Reviewed  CBC - Abnormal; Notable for the following:    WBC 24.0 (*)    RBC 5.98 (*)    Hemoglobin 15.1 (*)    MCV 74.4 (*)    MCH 25.3 (*)    RDW 15.6 (*)    All other components within normal limits  DIFFERENTIAL - Abnormal; Notable for the following:    Neutro Abs 10.8 (*)    Lymphs Abs 9.6 (*)    Monocytes Absolute 3.6 (*)    All other components within normal limits  COMPREHENSIVE METABOLIC PANEL - Abnormal; Notable for the following:    Sodium 131 (*)    Chloride 91 (*)    ALT 12 (*)    All other components within normal limits  URINALYSIS, ROUTINE W REFLEX MICROSCOPIC (  NOT AT [facility]) - Abnormal; Notable for the following:    Leukocytes, UA TRACE (*)    All other components within normal limits  URINE MICROSCOPIC-ADD ON - Abnormal; Notable for the following:    Squamous Epithelial / LPF 0-5 (*)    Bacteria, UA RARE (*)    All other components within normal limits  I-STAT CHEM 8, ED - Abnormal; Notable for the following:    Sodium 129 (*)    Chloride 88 (*)    Glucose, Bld 101 (*)    Calcium, Ion 1.05 (*)    Hemoglobin 16.0 (*)    HCT 47.0 (*)    All other components within normal limits  CULTURE, BLOOD (ROUTINE X 2)  CULTURE, BLOOD (ROUTINE X 2)  PROTIME-INR  APTT  I-STAT TROPOININ, ED  CBG MONITORING, ED    Imaging Review Ct Head Wo Contrast  09/04/2015  CLINICAL DATA:  Frontal headache and weakness all over for several weeks. EXAM: CT HEAD WITHOUT CONTRAST TECHNIQUE: Contiguous axial images were obtained from the base of the skull through the vertex without intravenous contrast. COMPARISON:   04/02/2006 FINDINGS: Left vertebral artery atherosclerotic calcification. Prominent sulcal effacement in the left cerebral hemisphere with extensive white matter hypodensity favoring vasogenic edema primarily in the left temporal, occipital, and parietal lobes. Effacement of the left lateral ventricle 7 mm of left-to-right midline shift. I do not observe definite associated hematoma nor is there an obvious abnormality in the splenium of the corpus callosum or tracking cross to the contralateral side. There is some mild crowding along the ambient cisterns. The white matter hypodensity tracks into the posterior limb of the left internal capsule and posteriorly into the left external capsule. No definite abnormal extra-axial fluid collection is identified. No overt trapping of the right lateral ventricle at this time. IMPRESSION: 1. Severe vasogenic edema in the left temporal, parietal, and occipital lobes associated with sulcal effacement in the left cerebral hemisphere, 7 mm of left-to-right midline shift, and some effacement of the ambient cistern and left lateral ventricle. This is a nonspecific but severe appearance which could reflect underlying tumor, encephalitis, or other severe inflammatory process. Pattern less likely to be due to stroke. When feasible, brain MRI with and without contrast is recommended for further assessment and characterization, and urgent neurologist consultation is recommended. Critical Value/emergent results were called by telephone at the time of interpretation on 09/04/2015 at 12:32 pm to Dr. Julianne Rice, who verbally acknowledged these results. Electronically Signed   By: Van Clines M.D.   On: 09/04/2015 12:34   I have personally reviewed and evaluated these images and lab results as part of my medical decision-making.   EKG Interpretation None      MDM   Final diagnoses:  Altered mental status    Patient with history of hyperlipidemia, remote TIA presents  with acute on chronic right-sided weakness, slurred speech, altered mental status. She's been declining over the last 3-4 months but acutely declined over the past 3-4 days. She is having difficulty with daily activities including drink coffee, remembering facts and walking without assistance.  Upon arrival vital signs stable. Patient has slowed mentation although she is GCS 15. She has some right-sided weakness as compared to the left and has dysmetria.  CT scan ordered in triage resulted with severe vasogenic edema with 7 mm of left-to-right midline shift concerning for tumor versus encephalitis versus inflammatory process.  12:46 PM: Neurosurgery paged for evaluation recommendations, MRI brain with/without ordered. Patient also with  white blood cell count 24.0.  1:15 PM: Neurosurgery called back, I discussed physical exam findings, laboratory results, CT imaging. Neurosurgery requested 10 mg IV Decadron and hospital admission under hospitalist service for cancer workup, monitoring MRI brain with/wo contrast. Paged for unassigned medical admission.  I discussed case with hospitalist, Dr. Waldron Labs who requested that I speak to neurology regarding possible admission to neuro ICU for hypertonic saline. I spoke with neurologist on call, Dr. Nicole Kindred. We reviewed images, case and per Dr. Nicole Kindred, no indication for hypertonic saline as patient is alert and awake. He recommended admission to hospitalist service.  Return paged to Dr. Urban Gibson who will admit patient to step-down unit.   Discussed with Dr. Lita Mains.      Vira Blanco, MD 09/04/15 1451  Julianne Rice, MD 09/07/15 1145

## 2015-09-04 NOTE — ED Notes (Addendum)
Pt in from home per spouse pt started to have difficulty completing sentences & was off balance onset last night at midnight, the pt reported to have slept well & upon awakening today @ 7am the pt woke up with similar symptoms & was not able to feed  Herself, pt seen by her PCP this Monday & was told her WBC 25 & that her PCP is getting in touch with a hematologist, pt A&O x3, pt unable to state year, pts  Spouse reports wt loss of 20 lbs over the last 4 mths d/t no appetite, no slurred speech present, L sided facial droop that husband reports is her baseline

## 2015-09-05 ENCOUNTER — Encounter (HOSPITAL_COMMUNITY): Payer: Self-pay | Admitting: Radiology

## 2015-09-05 ENCOUNTER — Inpatient Hospital Stay (HOSPITAL_COMMUNITY): Payer: PPO

## 2015-09-05 ENCOUNTER — Other Ambulatory Visit (HOSPITAL_COMMUNITY): Payer: PPO

## 2015-09-05 DIAGNOSIS — C7931 Secondary malignant neoplasm of brain: Secondary | ICD-10-CM

## 2015-09-05 DIAGNOSIS — I4891 Unspecified atrial fibrillation: Secondary | ICD-10-CM

## 2015-09-05 LAB — CBC
HCT: 42.8 % (ref 36.0–46.0)
HEMOGLOBIN: 14.3 g/dL (ref 12.0–15.0)
MCH: 24.7 pg — AB (ref 26.0–34.0)
MCHC: 33.4 g/dL (ref 30.0–36.0)
MCV: 74 fL — ABNORMAL LOW (ref 78.0–100.0)
Platelets: 202 10*3/uL (ref 150–400)
RBC: 5.78 MIL/uL — ABNORMAL HIGH (ref 3.87–5.11)
RDW: 15.5 % (ref 11.5–15.5)
WBC: 20.8 10*3/uL — ABNORMAL HIGH (ref 4.0–10.5)

## 2015-09-05 LAB — BASIC METABOLIC PANEL
ANION GAP: 9 (ref 5–15)
BUN: 7 mg/dL (ref 6–20)
CALCIUM: 8.9 mg/dL (ref 8.9–10.3)
CO2: 24 mmol/L (ref 22–32)
CREATININE: 0.58 mg/dL (ref 0.44–1.00)
Chloride: 96 mmol/L — ABNORMAL LOW (ref 101–111)
GFR calc Af Amer: >60 mL/min (ref >60)
GFR calc non Af Amer: >60 mL/min (ref >60)
GLUCOSE: 131 mg/dL — AB (ref 65–99)
Potassium: 3.6 mmol/L (ref 3.5–5.1)
Sodium: 129 mmol/L — ABNORMAL LOW (ref 135–145)

## 2015-09-05 LAB — TSH: TSH: 0.981 u[IU]/mL (ref 0.350–4.500)

## 2015-09-05 LAB — MRSA PCR SCREENING: MRSA by PCR: NEGATIVE

## 2015-09-05 MED ORDER — LEVETIRACETAM 500 MG PO TABS
500.0000 mg | ORAL_TABLET | Freq: Two times a day (BID) | ORAL | Status: DC
  Administered 2015-09-05 – 2015-09-08 (×6): 500 mg via ORAL
  Filled 2015-09-05 (×6): qty 1

## 2015-09-05 MED ORDER — BOOST / RESOURCE BREEZE PO LIQD
1.0000 | Freq: Two times a day (BID) | ORAL | Status: DC
  Administered 2015-09-05 – 2015-09-07 (×2): 1 via ORAL

## 2015-09-05 MED ORDER — DILTIAZEM LOAD VIA INFUSION
10.0000 mg | Freq: Once | INTRAVENOUS | Status: AC
  Administered 2015-09-05: 10 mg via INTRAVENOUS
  Filled 2015-09-05: qty 10

## 2015-09-05 MED ORDER — IOHEXOL 300 MG/ML  SOLN
25.0000 mL | Freq: Once | INTRAMUSCULAR | Status: AC | PRN
  Administered 2015-09-05: 25 mL via ORAL

## 2015-09-05 MED ORDER — PROPRANOLOL HCL 40 MG PO TABS
40.0000 mg | ORAL_TABLET | Freq: Two times a day (BID) | ORAL | Status: DC
  Administered 2015-09-06 – 2015-09-08 (×5): 40 mg via ORAL
  Filled 2015-09-05: qty 1
  Filled 2015-09-05: qty 2
  Filled 2015-09-05 (×6): qty 1

## 2015-09-05 MED ORDER — IOHEXOL 300 MG/ML  SOLN
100.0000 mL | Freq: Once | INTRAMUSCULAR | Status: AC | PRN
  Administered 2015-09-05: 100 mL via INTRAVENOUS

## 2015-09-05 MED ORDER — ENSURE ENLIVE PO LIQD: 237.0000 mL | Freq: Two times a day (BID) | ORAL | Status: DC

## 2015-09-05 MED ORDER — DILTIAZEM HCL 100 MG IV SOLR
5.0000 mg/h | INTRAVENOUS | Status: DC
  Administered 2015-09-05: 5 mg/h via INTRAVENOUS
  Filled 2015-09-05: qty 100

## 2015-09-05 NOTE — Evaluation (Signed)
Occupational Therapy Evaluation Patient Details Name: Susan Clark MRN: [id number] DOB: 1928/09/28 Today's Date: 09/05/2015    History of Present Illness Pt is an 82 yo female admitted with new onset weakness and dysmetria of the L side.  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  Pt to get CT this afternoon of lungs/abdomen/pelvis to r/o metastatic disease.   Clinical Impression   Pt admitted with the above diagnosis and has the deficits listed below. Pt would benefit from cont OT to increase independence with basic adls to a S to mod I level so she can d/c back home with her husband.  Pt would benefit from OPOT to follow up on R side coordination deficits and functional use of RUE.    Follow Up Recommendations  Outpatient OT;Supervision/Assistance - 24 hour    Equipment Recommendations  None recommended by OT    Recommendations for Other Services Speech consult     Precautions / Restrictions Precautions Precautions: Fall Restrictions Weight Bearing Restrictions: No      Mobility Bed Mobility Overal bed mobility: Needs Assistance Bed Mobility: Supine to Sit     Supine to sit: Supervision;HOB elevated     General bed mobility comments: HOB at 30 degrees.  Transfers Overall transfer level: Needs assistance Equipment used: 1 person hand held assist Transfers: Sit to/from [facility] Sit to Stand: Min guard Stand pivot transfers: Min assist       General transfer comment: Pt mildly impulsive coming to stand.  Pt states she does not have good sensation in RLE.     Balance Overall balance assessment: Needs assistance Sitting-balance support: Feet supported Sitting balance-Leahy Scale: Good     Standing balance support: During functional activity Standing balance-Leahy Scale: Fair Standing balance comment: Pt could stand unassisted for short amounts of time.  Feel she needed assist when taking challenges.                             ADL Overall ADL's : Needs assistance/impaired Eating/Feeding: Set up;Sitting Eating/Feeding Details (indicate cue type and reason): min assist with set up due to RUE but overall doing very well feeding self compared to 3/9 when she was unable to do so with her RUE.   Grooming: Dance movement psychotherapist;Wash/dry hands;Min guard;Standing   Upper Body Bathing: Set up;Sitting   Lower Body Bathing: Minimal assistance;Sit to/from stand Lower Body Bathing Details (indicate cue type and reason): pt fatigued quickly needing rest breaks and assist when standing Upper Body Dressing : Minimal assistance;Sitting Upper Body Dressing Details (indicate cue type and reason): min assist with fasteners. Lower Body Dressing: Minimal assistance;Sit to/from stand Lower Body Dressing Details (indicate cue type and reason): min assist to stand and fasten pants.  Pt unsteady in standing while taking challenges. Toilet Transfer: Minimal assistance;Ambulation;Comfort height toilet;Grab bars   Toileting- Clothing Manipulation and Hygiene: Sitting/lateral lean;Min guard       Functional mobility during ADLs: Minimal assistance General ADL Comments: Pt does well with adls in sitting.  Pt requires more assist when in standing due to mild balance deficts.     Vision Vision Assessment?: Yes Eye Alignment: Within Functional Limits Ocular Range of Motion: Within Functional Limits Alignment/Gaze Preference: Gaze left;Other (comment) (very mild) Tracking/Visual Pursuits: Able to track stimulus in all quads without difficulty Convergence: Within functional limits Visual Fields: Right superior homonymous quadranopsia;Impaired-to be further tested in functional context (may or may not be a field  cut.  May just be poor acuity)   Perception Perception Perception Tested?: Yes Perception Deficits: Inattention/neglect (mild on the R) Inattention/Neglect: Does not attend to right visual field;Impaired- to be further  tested in functional context   Cameron tested?: Within functional limits    Pertinent Vitals/Pain Pain Assessment: Faces Faces Pain Scale: Hurts little more Pain Location: R hip and back pain. Pain Descriptors / Indicators: Aching Pain Intervention(s): Monitored during session;Repositioned     Hand Dominance Right   Extremity/Trunk Assessment Upper Extremity Assessment Upper Extremity Assessment: RUE deficits/detail RUE Deficits / Details: Strength now appears 4+/5.  Very functional.  Pt with mild corrdination deficits in RUE.   RUE Sensation: decreased light touch RUE Coordination: decreased fine motor   Lower Extremity Assessment Lower Extremity Assessment: Defer to PT evaluation   Cervical / Trunk Assessment Cervical / Trunk Assessment: Other exceptions Cervical / Trunk Exceptions: c/o long standing back pain   Communication Communication Communication: Expressive difficulties   Cognition Arousal/Alertness: Awake/alert Behavior During Therapy: WFL for tasks assessed/performed Overall Cognitive Status: Impaired/Different from baseline Area of Impairment: Memory;Awareness     Memory: Decreased short-term memory     Awareness: Emergent   General Comments: Pt with mild memory deficits starting a few months ago.     General Comments       Exercises       Shoulder Instructions      Home Living Family/patient expects to be discharged to:: Private residence Living Arrangements: Spouse/significant other Available Help at Discharge: Family;Available 24 hours/day Type of Home: House Home Access: Stairs to enter [employer] of Steps: 3 Entrance Stairs-Rails: Right;Left;Can reach both Home Layout: One level;Other (Comment) (1 step to sunken in family room)     Bathroom Shower/Tub: Tub/shower unit;Curtain Shower/tub characteristics: Architectural technologist: Standard     Home Equipment: Environmental consultant - 2 wheels;Bedside commode;Grab bars -  tub/shower   Additional Comments: Pt lives with husband who works from home. Son also lives next door and works from home. Very supportive family.      Prior Functioning/Environment Level of Independence: Independent        Comments: Pt independent up until last 1-2 months when she started to have word finding problems, mild cognitive deficits and then the onset of  R side weakness.    OT Diagnosis: Generalized weakness;Cognitive deficits;Disturbance of vision;Acute pain   OT Problem List: Decreased strength;Impaired balance (sitting and/or standing);Impaired vision/perception;Decreased coordination;Decreased cognition;Decreased safety awareness;Decreased knowledge of use of DME or AE;Impaired sensation;Impaired UE functional use;Pain   OT Treatment/Interventions: Self-care/ADL training;Therapeutic exercise;DME and/or AE instruction;Therapeutic activities;Visual/perceptual remediation/compensation    OT Goals(Current goals can be found in the care plan section) Acute Rehab OT Goals Patient Stated Goal: to be back home independent OT Goal Formulation: With patient Time For Goal Achievement: 09/19/15 Potential to Achieve Goals: Good ADL Goals Pt Will Perform Lower Body Dressing: sit to/from stand;with modified independence Pt Will Perform Tub/Shower Transfer: Tub transfer;with supervision;ambulating Pt/caregiver will Perform Home Exercise Program: Right Upper extremity;With theraputty;With written HEP provided Additional ADL Goal #1: Pt will walk to bathroom and toilet with S.  OT Frequency: Min 2X/week   Barriers to D/C:            Co-evaluation PT/OT/SLP Co-Evaluation/Treatment: Yes Reason for Co-Treatment: For patient/therapist safety PT goals addressed during session: Mobility/safety with mobility OT goals addressed during session: ADL's and self-care      End of Session Nurse Communication: Mobility status  Activity Tolerance: Patient tolerated treatment well Patient  left: in chair;with call bell/phone within reach;with chair alarm set;with family/visitor present   Time: 1030-1105 OT Time Calculation (min): 35 min Charges:  OT General Charges $OT Visit: 1 Procedure OT Evaluation $OT Eval Moderate Complexity: 1 Procedure G-Codes:    Glenford Peers 09/24/15, 11:30 AM  [phone]

## 2015-09-05 NOTE — Progress Notes (Addendum)
PROGRESS NOTE  Susan Clark [id number] DOB: 01-11-1929 DOA: 09/04/2015 PCP: Stephens Shire, MD  Assessment/Plan: Metastatic melanoma to the brain- causing extensive vasogenic edema with early LEFT uncal herniation and 7 mm LEFT-to-RIGHT shift. Nonfocal neurological deficits secondary to vasogenic brain edema - Decadron and Keppra for seizure prophylaxis - Continue with neuro checks every 12 hours. -CT chest/abd/pelvis  A fib with RVR  -cardiazem gtt -echo -TSH  Leukocytosis - Patient is afebrile, unclear etiology, negative urinalysis, will check 2 view chest x-ray and will check cultures, vision does not appear toxic, no indication for antibiotics.  Coronary artery disease - Denies any chest pain or shortness of breath, continue to hold aspirin for possible need for surgical procedure.  Hypertension - Uncontrolled, continue with home medication  Hyperlipidemia - Continue with statin  Code Status: full- discussed with family Family Communication: son/husband Disposition Plan:    Consultants:  NSRonnald Ramp  Dr. Tammi Klippel  Procedures:      HPI/Subjective: Patient now in  A fib with RVR Son says she has been complaining of severe hip pain--- sometimes unable to walk   Objective: Filed Vitals:   09/05/15 0357 09/05/15 0728  BP: 123/63 114/56  Pulse: 56 91  Temp: 97.9 F (36.6 C)   Resp: 17 21    Intake/Output Summary (Last 24 hours) at 09/05/15 0827 Last data filed at 09/05/15 0400  Gross per 24 hour  Intake 1303.75 ml  Output    400 ml  Net 903.75 ml   Filed Weights   09/04/15 1141 09/04/15 2352  Weight: 63.192 kg (139 lb 5 oz) 62.5 kg (137 lb 12.6 oz)    Exam:   General:  Awake, NAD  Cardiovascular: irr and fast  Respiratory: no wheezing  Abdomen: +BS, soft  Musculoskeletal: no edema   Data Reviewed: Basic Metabolic Panel:  Recent Labs Lab 09/04/15 1140 09/04/15 1145 09/05/15 0518  NA 131* 129* 129*  K 3.9 3.6 3.6  CL 91*  88* 96*  CO2 27  --  24  GLUCOSE 99 101* 131*  BUN 6 6 7   CREATININE 0.58 0.50 0.58  CALCIUM 9.3  --  8.9   Liver Function Tests:  Recent Labs Lab 09/04/15 1140  AST 19  ALT 12*  ALKPHOS 60  BILITOT 0.8  PROT 6.9  ALBUMIN 3.6   No results for input(s): LIPASE, AMYLASE in the last 168 hours. No results for input(s): AMMONIA in the last 168 hours. CBC:  Recent Labs Lab 09/04/15 1140 09/04/15 1145 09/05/15 0518  WBC 24.0*  --  20.8*  NEUTROABS 10.8*  --   --   HGB 15.1* 16.0* 14.3  HCT 44.5 47.0* 42.8  MCV 74.4*  --  74.0*  PLT 212  --  202   Cardiac Enzymes: No results for input(s): CKTOTAL, CKMB, CKMBINDEX, TROPONINI in the last 168 hours. BNP (last 3 results) No results for input(s): BNP in the last 8760 hours.  ProBNP (last 3 results) No results for input(s): PROBNP in the last 8760 hours.  CBG: No results for input(s): GLUCAP in the last 168 hours.  Recent Results (from the past 240 hour(s))  MRSA PCR Screening     Status: None   Collection Time: 09/05/15 12:40 AM  Result Value Ref Range Status   MRSA by PCR NEGATIVE NEGATIVE Final    Comment:        The GeneXpert MRSA Assay (FDA approved for NASAL specimens only), is one component of a comprehensive MRSA colonization surveillance program.  It is not intended to diagnose MRSA infection nor to guide or monitor treatment for MRSA infections.      Studies: Dg Chest 2 View  09/04/2015  CLINICAL DATA:  Leukocytosis. Severe vasogenic edema in the left cerebral hemisphere. EXAM: CHEST  2 VIEW COMPARISON:  09/18/2007 FINDINGS: Scattered pulmonary nodules are present. A mass at the right lung base measures 3.1 cm in diameter. Appearance favoring pulmonary metastatic disease bilaterally. Atherosclerotic aortic arch. IMPRESSION: 1. Numerous scattered bilateral pulmonary nodules and a right basilar pulmonary mass. Appearance favors metastatic disease to both lungs, especially in light of the vasogenic edema in  the brain. 2. Atherosclerotic aortic arch. Electronically Signed   By: Van Clines M.D.   On: 09/04/2015 15:22   Ct Head Wo Contrast  09/04/2015  CLINICAL DATA:  Frontal headache and weakness all over for several weeks. EXAM: CT HEAD WITHOUT CONTRAST TECHNIQUE: Contiguous axial images were obtained from the base of the skull through the vertex without intravenous contrast. COMPARISON:  04/02/2006 FINDINGS: Left vertebral artery atherosclerotic calcification. Prominent sulcal effacement in the left cerebral hemisphere with extensive white matter hypodensity favoring vasogenic edema primarily in the left temporal, occipital, and parietal lobes. Effacement of the left lateral ventricle 7 mm of left-to-right midline shift. I do not observe definite associated hematoma nor is there an obvious abnormality in the splenium of the corpus callosum or tracking cross to the contralateral side. There is some mild crowding along the ambient cisterns. The white matter hypodensity tracks into the posterior limb of the left internal capsule and posteriorly into the left external capsule. No definite abnormal extra-axial fluid collection is identified. No overt trapping of the right lateral ventricle at this time. IMPRESSION: 1. Severe vasogenic edema in the left temporal, parietal, and occipital lobes associated with sulcal effacement in the left cerebral hemisphere, 7 mm of left-to-right midline shift, and some effacement of the ambient cistern and left lateral ventricle. This is a nonspecific but severe appearance which could reflect underlying tumor, encephalitis, or other severe inflammatory process. Pattern less likely to be due to stroke. When feasible, brain MRI with and without contrast is recommended for further assessment and characterization, and urgent neurologist consultation is recommended. Critical Value/emergent results were called by telephone at the time of interpretation on 09/04/2015 at 12:32 pm to Dr.  Julianne Rice, who verbally acknowledged these results. Electronically Signed   By: Van Clines M.D.   On: 09/04/2015 12:34   Mr Jeri Cos [id number] Contrast  09/04/2015  CLINICAL DATA:  Slowly progressive visual loss, difficulty with cognition, and RIGHT-sided weakness. History of melanoma resection LEFT neck 2 years ago. EXAM: MRI HEAD WITHOUT AND WITH CONTRAST TECHNIQUE: Multiplanar, multiecho pulse sequences of the brain and surrounding structures were obtained without and with intravenous contrast. CONTRAST:  83mL MULTIHANCE GADOBENATE DIMEGLUMINE 529 MG/ML IV SOLN COMPARISON:  CT head earlier today. FINDINGS: No acute stroke, hydrocephalus, or extra-axial fluid. As predicted from CT, there is an intracranial mass contributing to vasogenic edema. The mass is solitary, located in the LEFT occipital lobe, superficial, but intra-axial. There is T1 hyperintensity precontrast with avid enhancement postcontrast. The lesion measures 33 x 31 x 29 mm, and does share an inferior border with the LEFT transverse sinus. Overall the mass is hypointense on T2 weighted imaging. Gradient sequence does not demonstrate acute hemorrhage, but a few prominent vessels are noted. Early uncal herniation is noted, with LEFT-to-RIGHT shift of 7 mm at the anterior third ventricle level. Normal for age cerebral volume.  Minor white matter disease. No hydrocephalus or ventricular trapping. No tonsillar herniation. Extracranial soft tissues unremarkable. IMPRESSION: Solitary, greater than 3 cm, LEFT occipital intra-axial metastasis, demonstrating precontrast T1 shortening consistent with metastatic melanoma. Avid postcontrast enhancement and prominent intratumoral vessels suggest a highly vascular lesion. Extensive vasogenic edema with early LEFT uncal herniation and 7 mm LEFT-to-RIGHT shift. Electronically Signed   By: Staci Righter M.D.   On: 09/04/2015 19:10    Scheduled Meds: . dexamethasone  4 mg Intravenous 4 times per day  .  diltiazem  10 mg Intravenous Once  . feeding supplement (ENSURE ENLIVE)  237 mL Oral BID BM  . levETIRAcetam  500 mg Intravenous Q12H  . pantoprazole  40 mg Oral BID  . simvastatin  20 mg Oral QHS  . sodium chloride flush  3 mL Intravenous Q12H   Continuous Infusions: . sodium chloride 75 mL/hr at 09/04/15 2330  . diltiazem (CARDIZEM) infusion     Antibiotics Given (last 72 hours)    None      Active Problems:   Ischemic heart disease   Vasogenic brain edema (HCC)   Leucocytosis    Time spent: 35 min    [facility] Pager [phone]. If 7PM-7AM, please contact night-coverage at [email or web address], password [email or web address] 09/05/2015, 8:27 AM  LOS: 1 day

## 2015-09-05 NOTE — Progress Notes (Signed)
Initial Nutrition Assessment  DOCUMENTATION CODES:   Severe malnutrition in context of acute illness/injury  INTERVENTION:  -d/c Ensure Enlive  -Provide Boost Breeze BID, 250 kcal, 9 grams of protein per bottle  NUTRITION DIAGNOSIS:   Malnutrition related to chronic illness as evidenced by energy intake < 75% for > or equal to 1 month, moderate depletions of muscle mass, moderate depletion of body fat, percent weight loss.  GOAL:   Patient will meet greater than or equal to 90% of their needs  MONITOR:   PO intake, Supplement acceptance, Labs, Weight trends, I & O's  REASON FOR ASSESSMENT:   Malnutrition Screening Tool  ASSESSMENT:   Pt With history of hypertension, coronary artery disease, hyperlipidemia, presents with complaints of generalized weakness, weight loss, and fatigue over last few weeks, pt's husband and son-in-law report patient lost 20 pounds over last 2 month. Found to have metastatic melanoma of the brain causing extensive vasogenic edema.   Pt seen for MST. Pt states her appetite has been very poor past 2-3 months. She reports only being able to "nibble" at meal times. Pt states she does not like Ensure Enlive, will discontinue. Pt agreeable to Boost Breeze while her appetite is poor. Pt concerned about sweet tate of Boost Breeze, encourage pt to mix with ginger ale to help minimize sweet taste.  Pt reports a 20 lb (13%) weight loss within 6 months, which is significant for time frame. Weight history per chart supports pt's report.   Conducted nutrition focused physical exam, identified moderate muscle wasting and moderate fat wasting. No edema present  Pt is classified as severely malnourished in the context of chronic illness due to 13% weight loss, <75% energy intake for >1 month, and moderate muscle and fat wasting.     Medications reviewed.  Labs reviewed; sodium129  Diet Order:  Diet Heart Room service appropriate?: Yes; Fluid consistency::  Thin  Skin:  Reviewed, no issues  Last BM:  08/28/2015  Height:   Ht Readings from Last 1 Encounters:  09/04/15 5\' 5"  (1.651 m)    Weight:   Wt Readings from Last 1 Encounters:  09/04/15 137 lb 12.6 oz (62.5 kg)    Ideal Body Weight:  56.8 kg  BMI:  Body mass index is 22.93 kg/(m^2).  Estimated Nutritional Needs:   Kcal:  1500-1700  Protein:  75-85 grams   Fluid:  1.5-1.7 L  EDUCATION NEEDS:   No education needs identified at this time  Raford Pitcher, Dietetic Intern Pager: [phone]  I agree with student dietitian note; appropriate revisions have been made.  Molli Barrows, RD, LDN, [place] Pager# [phone] After Hours Pager# [phone]

## 2015-09-05 NOTE — Consult Note (Signed)
Radiation Oncology         [phone] ________________________________  Initial outpatient Consultation  Name: Susan Clark MRN: [id number]  Date: 09/04/2015  DOB: 07-04-1928  [id number] A, MD  No ref. provider found   REFERRING PHYSICIAN: No ref. provider found  DIAGNOSIS: The primary encounter diagnosis was Metastatic melanoma of brain ([place]). Diagnoses of Altered mental status, Leucocytosis, and Cancer ([place]) were also pertinent to this visit.    ICD-9-CM ICD-10-CM   1. Metastatic melanoma of brain (HCC) 198.3 C79.31   2. Altered mental status 780.97 R41.82 MR Brain W Wo Contrast     MR Brain W Wo Contrast     CANCELED: MR Brain W Wo Contrast     CANCELED: MR Brain W Wo Contrast  3. Leucocytosis 288.60 D72.829 DG Chest 2 View     DG Chest 2 View  4. Cancer (HCC) 199.1 C80.1     HISTORY OF PRESENT ILLNESS: Susan Clark is a 82 y.o. female seen at the request of Dr. Ronnald Ramp for a new lesion in the brain that appears concerning for metastatic melanoma. The patient reports that about 6 years ago in 2011, she had surgical excision along the left aspect of her neck which turned out to be melanoma. She did not require any additional medications are treatment for this, and has been followed closely by [facility] Red[facility]. She reports that she was seen last week on Friday, an area of change was noted along the previous surgical site, which was biopsied. The findings from her pathology has not been reviewed with resultant to the patient's knowledge.   Apparently during the last few weeks she had some decline in terms of weakness, and acutely developed changes in her visual field with complete loss of vision in her left eye, as well as some changes in auditory acuity. Acutely, however she has been experiencing some change in terms of confusion, and mental status changes. She was seen in the emergency department on 09/04/2015, and a CT scan of the head without contrast  revealed severe vasogenic edema in the left temporal, parietal and occipital lobes with a 7 mm left-to-right midline shift, and a MRI was then performed revealing a 3.3 x 3.1 x 2.9 cm mass along the left occipital lobe, superficial but intra-axial. No evidence of acute hemorrhage was present but a few prominent vessels were noted. Early uncal herniation is noted and left-to-right shift was also seen again in measuring 7 mm at the anterior third ventricle level. Also of note patient had a chest x-ray performed in the emergency department revealing numerous scattered bilateral pulmonary nodules and a right basilar pulmonary mass concerning for metastatic disease. Dr. Ronnald Ramp has seen and evaluated the patient, and has recommended consideration of radiotherapy either a single modality or combination therapy for this patient.   PREVIOUS RADIATION THERAPY: No  PAST MEDICAL HISTORY:  Past Medical History  Diagnosis Date  . Palpitations     occasional  . Chronic back pain       PAST SURGICAL HISTORY: Past Surgical History  Procedure Laterality Date  . Tonsillectomy and adenoidectomy    . Partial hysterectomy  1971  . Anterior cervical discectomy  03/28/2006    C4-C5  . Cardiovascular stress test  09/13/2007    EF 74%, normal study  . Coronary angioplasty with stent placement  2004    UNKNOWN WHICH TYPE    FAMILY HISTORY:  Family History  Problem Relation Age of Onset  .  Heart disease Father   . Arthritis Father   . Heart attack Father   . Cancer Sister     colon  . Colon cancer Sister   . Hypertension Mother   . Cancer Mother     lung cancer  . Emphysema Brother   . Bone cancer Brother   . Bladder Cancer Brother     SOCIAL HISTORY:  Social History   Social History  . Marital Status: Married    Spouse Name: N/A  . Number of Children: 4  . Years of Education: N/A   Occupational History  .     Social History Main Topics  . Smoking status: Never Smoker   . Smokeless tobacco:  Not on file  . Alcohol Use: No  . Drug Use: Not on file  . Sexual Activity: Not on file   Other Topics Concern  . Not on file   Social History Narrative  The patient is married and resides in [place]. She has 2 sons. She reports that she drove until the age of 8 and stopped due to cataracts and changes in visual acuity. She reports that she otherwise is pretty active.  ALLERGIES: Buprenorphine hcl; Morphine and related; and Prednisone  MEDICATIONS:  Current Facility-Administered Medications  Medication Dose Route Frequency Provider Last Rate Last Dose  . 0.9 %  sodium chloride infusion   Intravenous Continuous Albertine Patricia, MD 75 mL/hr at 09/04/15 2330    . acetaminophen (TYLENOL) tablet 650 mg  650 mg Oral Q6H PRN Albertine Patricia, MD   650 mg at 09/05/15 0037   Or  . acetaminophen (TYLENOL) suppository 650 mg  650 mg Rectal Q6H PRN Silver Huguenin Elgergawy, MD      . albuterol (PROVENTIL) (2.5 MG/3ML) 0.083% nebulizer solution 2.5 mg  2.5 mg Nebulization Q2H PRN Silver Huguenin Elgergawy, MD      . dexamethasone (DECADRON) injection 4 mg  4 mg Intravenous 4 times per day Albertine Patricia, MD   4 mg at 09/05/15 0539  . diltiazem (CARDIZEM) 100 mg in dextrose 5 % 100 mL (1 mg/mL) infusion  5-15 mg/hr Intravenous Continuous Geradine Girt, DO 5 mL/hr at 09/05/15 0830 5 mg/hr at 09/05/15 0830  . feeding supplement (ENSURE ENLIVE) (ENSURE ENLIVE) liquid 237 mL  237 mL Oral BID BM Albertine Patricia, MD   237 mL at 09/05/15 0948  . levETIRAcetam (KEPPRA) 500 mg in sodium chloride 0.9 % 100 mL IVPB  500 mg Intravenous Q12H Albertine Patricia, MD 420 mL/hr at 09/05/15 0237 500 mg at 09/05/15 0237  . nitroGLYCERIN (NITROSTAT) SL tablet 0.4 mg  0.4 mg Sublingual Q5 min PRN Albertine Patricia, MD      . ondansetron (ZOFRAN) tablet 4 mg  4 mg Oral Q6H PRN Albertine Patricia, MD       Or  . ondansetron (ZOFRAN) injection 4 mg  4 mg Intravenous Q6H PRN Silver Huguenin Elgergawy, MD      . pantoprazole  (PROTONIX) EC tablet 40 mg  40 mg Oral BID Albertine Patricia, MD   40 mg at 09/05/15 0941  . simvastatin (ZOCOR) tablet 20 mg  20 mg Oral QHS Albertine Patricia, MD   20 mg at 09/04/15 2327  . sodium chloride flush (NS) 0.9 % injection 3 mL  3 mL Intravenous Q12H Albertine Patricia, MD   3 mL at 09/04/15 2245    REVIEW OF SYSTEMS:  On review of systems, she discusses that  in the past several weeks she has been experiencing increasing generalized weakness. She describes her visual change as being complete loss of vision in the left eye that has occurred on several occasions that has not occurred. She states that it would take several minutes for her vision to return and she would have to) very tightly. She states that she is also lost about 20 pounds in that time frame. She states that she's been experiencing a frontal headache over the site of her left eye for a few weeks as well. She denies tinnitus. She is verbalized terrible pain in her right hip and a change in the sensation of the right anterior thigh in the past few weeks as well. She reports that she has also had some episodes of low neck pain. She denies any fevers or chills, or night sweats. She denies any vomiting but has had intermittent episodes of nausea for the last few weeks. She denies any bowel or bladder dysfunction. She has noticed increasing weakness with walking but has not had any episodes of falls. She denies any chest pain or shortness of breath. A complete review of systems obtained and is otherwise negative.   PHYSICAL EXAM:  height is 5\' 5"  (1.651 m) and weight is 137 lb 12.6 oz (62.5 kg). Her oral temperature is 97.9 F (36.6 C). Her blood pressure is 103/66 and her pulse is 62. Her respiration is 17 and oxygen saturation is 97%.   Pain Scale 0/10  In general this is a well-appearing Caucasian female who appears her slightly younger than her stated age. She is alert and oriented 4 and appropriate during the conversation. Her  speech is fluent with normal language no evidence of expressive aphasia. Cranial nerves are assessed, cranial nerves II through XII are intact. Strength is symmetric and 5 out of 5 in bilateral upper and lower extremities with normal muscle mass and tone. Gait is not assessed. Sensation is intact to light touch, and biceps and brachioradialis reflexes are 2+ and symmetric bilaterally.  Cardiovascular exam reveals a regular rate and rhythm, no clicks rubs or murmurs are auscultated. Chest is auscultated bilaterally. No palpable abnormalities are noted of the supraclavicular, cervical, or axillary lymph node chains. Along her left neck there is a punch biopsy site that appears to be healing well, without evidence of cellulitic change. No lesions are otherwise appreciated. Her abdomen is auscultated and has active bowel sounds in all quadrants, the abdomen is soft, nontender, nondistended. Lower extremities are negative for pretibial pitting edema deep calf tenderness cyanosis or clubbing.     KPS = 70  100 - Normal; no complaints; no evidence of disease. 90   - Able to carry on normal activity; minor signs or symptoms of disease. 80   - Normal activity with effort; some signs or symptoms of disease. 58   - Cares for self; unable to carry on normal activity or to do active work. 60   - Requires occasional assistance, but is able to care for most of his personal needs. 50   - Requires considerable assistance and frequent medical care. 61   - Disabled; requires special care and assistance. 9   - Severely disabled; hospital admission is indicated although death not imminent. 5   - Very sick; hospital admission necessary; active supportive treatment necessary. 10   - Moribund; fatal processes progressing rapidly. 0     - Dead  Karnofsky DA, Abelmann WH, Craver LS and [facility] [phone]) The use of  the nitrogen mustards in the palliative treatment of carcinoma: with particular reference to bronchogenic  carcinoma Cancer 1 634-56  LABORATORY DATA:  Lab Results  Component Value Date   WBC 20.8* 09/05/2015   HGB 14.3 09/05/2015   HCT 42.8 09/05/2015   MCV 74.0* 09/05/2015   PLT 202 09/05/2015   Lab Results  Component Value Date   NA 129* 09/05/2015   K 3.6 09/05/2015   CL 96* 09/05/2015   CO2 24 09/05/2015   Lab Results  Component Value Date   ALT 12* 09/04/2015   AST 19 09/04/2015   ALKPHOS 60 09/04/2015   BILITOT 0.8 09/04/2015     RADIOGRAPHY: Dg Chest 2 View  09/04/2015  CLINICAL DATA:  Leukocytosis. Severe vasogenic edema in the left cerebral hemisphere. EXAM: CHEST  2 VIEW COMPARISON:  09/18/2007 FINDINGS: Scattered pulmonary nodules are present. A mass at the right lung base measures 3.1 cm in diameter. Appearance favoring pulmonary metastatic disease bilaterally. Atherosclerotic aortic arch. IMPRESSION: 1. Numerous scattered bilateral pulmonary nodules and a right basilar pulmonary mass. Appearance favors metastatic disease to both lungs, especially in light of the vasogenic edema in the brain. 2. Atherosclerotic aortic arch. Electronically Signed   By: Van Clines M.D.   On: 09/04/2015 15:22   Ct Head Wo Contrast  09/04/2015  CLINICAL DATA:  Frontal headache and weakness all over for several weeks. EXAM: CT HEAD WITHOUT CONTRAST TECHNIQUE: Contiguous axial images were obtained from the base of the skull through the vertex without intravenous contrast. COMPARISON:  04/02/2006 FINDINGS: Left vertebral artery atherosclerotic calcification. Prominent sulcal effacement in the left cerebral hemisphere with extensive white matter hypodensity favoring vasogenic edema primarily in the left temporal, occipital, and parietal lobes. Effacement of the left lateral ventricle 7 mm of left-to-right midline shift. I do not observe definite associated hematoma nor is there an obvious abnormality in the splenium of the corpus callosum or tracking cross to the contralateral side. There is some  mild crowding along the ambient cisterns. The white matter hypodensity tracks into the posterior limb of the left internal capsule and posteriorly into the left external capsule. No definite abnormal extra-axial fluid collection is identified. No overt trapping of the right lateral ventricle at this time. IMPRESSION: 1. Severe vasogenic edema in the left temporal, parietal, and occipital lobes associated with sulcal effacement in the left cerebral hemisphere, 7 mm of left-to-right midline shift, and some effacement of the ambient cistern and left lateral ventricle. This is a nonspecific but severe appearance which could reflect underlying tumor, encephalitis, or other severe inflammatory process. Pattern less likely to be due to stroke. When feasible, brain MRI with and without contrast is recommended for further assessment and characterization, and urgent neurologist consultation is recommended. Critical Value/emergent results were called by telephone at the time of interpretation on 09/04/2015 at 12:32 pm to Dr. Julianne Rice, who verbally acknowledged these results. Electronically Signed   By: Van Clines M.D.   On: 09/04/2015 12:34   Mr Jeri Cos [id number] Contrast  09/04/2015  CLINICAL DATA:  Slowly progressive visual loss, difficulty with cognition, and RIGHT-sided weakness. History of melanoma resection LEFT neck 2 years ago. EXAM: MRI HEAD WITHOUT AND WITH CONTRAST TECHNIQUE: Multiplanar, multiecho pulse sequences of the brain and surrounding structures were obtained without and with intravenous contrast. CONTRAST:  52mL MULTIHANCE GADOBENATE DIMEGLUMINE 529 MG/ML IV SOLN COMPARISON:  CT head earlier today. FINDINGS: No acute stroke, hydrocephalus, or extra-axial fluid. As predicted from CT, there is an intracranial mass  contributing to vasogenic edema. The mass is solitary, located in the LEFT occipital lobe, superficial, but intra-axial. There is T1 hyperintensity precontrast with avid enhancement  postcontrast. The lesion measures 33 x 31 x 29 mm, and does share an inferior border with the LEFT transverse sinus. Overall the mass is hypointense on T2 weighted imaging. Gradient sequence does not demonstrate acute hemorrhage, but a few prominent vessels are noted. Early uncal herniation is noted, with LEFT-to-RIGHT shift of 7 mm at the anterior third ventricle level. Normal for age cerebral volume. Minor white matter disease. No hydrocephalus or ventricular trapping. No tonsillar herniation. Extracranial soft tissues unremarkable. IMPRESSION: Solitary, greater than 3 cm, LEFT occipital intra-axial metastasis, demonstrating precontrast T1 shortening consistent with metastatic melanoma. Avid postcontrast enhancement and prominent intratumoral vessels suggest a highly vascular lesion. Extensive vasogenic edema with early LEFT uncal herniation and 7 mm LEFT-to-RIGHT shift. Electronically Signed   By: Staci Righter M.D.   On: 09/04/2015 19:10      IMPRESSION/PLAN: 1. Probable metastatic melanoma to the brain. A CT scan of the chest abdomen and pelvis has been ordered, patient formal staging workup today. We will follow-up with these results were available. Dr. Tammi Klippel his reviews the patient's films her CT scan and MRI, and is recommended and consideration of stereotactic radiosurgery preoperatively versus is a single modality area we will continue to discuss this further with Dr. Ronnald Ramp. We will await the results of her imaging studies, and follow-up with her as to the timing of moving forward with simulation and planning for SRS. The patient is counseled on the risks, benefits, and typical example of treatment would include. She is interested in moving forward with SRS when it is medically appropriate. We agree with continuation of her steroids, and will defer antiepileptic precautions to Dr. Ronnald Ramp. 2. Skin lesion with a history of melanoma. I contacted the patient's dermatologist at the skin surgery [facility] in  [place], Dr. Redmond Pulling did perform a biopsy last Friday, results are still pending at the time of dictation.  Carola Rhine, PAC

## 2015-09-05 NOTE — Progress Notes (Signed)
Patient ID: Susan Clark, female   DOB: 27-May-1929, 82 y.o.   MRN: [id number] No change in exam. Spoke with them re: MRI findings. suspect melanoma. CT body P. Dr. Tammi Klippel of radiation oncology so he can consult on her and help with decision making. If prognosis is reasonable then she would be a candidate for a left occipital craniotomy for resection of the tumor versus SRS

## 2015-09-05 NOTE — Evaluation (Signed)
Physical Therapy Evaluation Patient Details Name: Susan Clark MRN: [id number] DOB: 1928-07-30 Today's Date: 09/05/2015   History of Present Illness  Pt is an 82 yo female admitted with new onset weakness and dysmetria of the L side.  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  Pt to get CT this afternoon of lungs/abdomen/pelvis to r/o metastatic disease.  Clinical Impression  Pt admitted with above diagnosis. Pt currently with functional limitations due to the deficits listed below (see PT Problem List). Susan Clark is eager to return home w/ as much independence as possible.  She presents w/ minimal Rt sided weakness and impaired coordination, requiring min assist at times when ambulating for stability.  Her husband and son, who lives next door, will be available for assist pt 24/7 and drive pt to PT appointments. Pt will benefit from skilled PT to increase their independence and safety with mobility to allow discharge to the venue listed below.      Follow Up Recommendations Outpatient PT;Supervision/Assistance - 24 hour    Equipment Recommendations  Cane    Recommendations for Other Services       Precautions / Restrictions Precautions Precautions: Fall Restrictions Weight Bearing Restrictions: No      Mobility  Bed Mobility Overal bed mobility: Needs Assistance Bed Mobility: Supine to Sit     Supine to sit: Supervision;HOB elevated     General bed mobility comments: HOB at 30 degrees, no cues or physical assist needed  Transfers Overall transfer level: Needs assistance Equipment used: 1 person hand held assist Transfers: Sit to/from Stand Sit to Stand: Min guard Stand pivot transfers: Min assist       General transfer comment: Pt mildly impulsive coming to stand.  Pt states she does not have good sensation in RLE.   Ambulation/Gait Ambulation/Gait assistance: Min assist Ambulation Distance (Feet): 80 Feet Assistive device: 1 person hand  held assist;None Gait Pattern/deviations: Step-through pattern;Decreased stride length;Drifts right/left   Gait velocity interpretation: Below normal speed for age/gender General Gait Details: Initial 20 ft provided 1 person HHA to steady before transitioning to ambulating w/o HHA.  Pt then required min assist only w/ head turns.    Stairs            Wheelchair Mobility    Modified Rankin (Stroke Patients Only)       Balance Overall balance assessment: Needs assistance Sitting-balance support: Feet supported;No upper extremity supported Sitting balance-Leahy Scale: Good     Standing balance support: No upper extremity supported;During functional activity Standing balance-Leahy Scale: Fair Standing balance comment: Pt could stand unassisted for short amounts of time.  Feel she needed assist when taking challenges.                             Pertinent Vitals/Pain Pain Assessment: Faces Faces Pain Scale: Hurts little more Pain Location: Rt hip and back Pain Descriptors / Indicators: Aching Pain Intervention(s): Monitored during session    Home Living Family/patient expects to be discharged to:: Private residence Living Arrangements: Spouse/significant other Available Help at Discharge: Family;Available 24 hours/day Type of Home: House Home Access: Stairs to enter Entrance Stairs-Rails: Right;Left;Can reach both Entrance Stairs-Number of Steps: 3 Home Layout: One level;Other (Comment) (1 step to sunken in family room) Home Equipment: Walker - 2 wheels;Bedside commode;Grab bars - tub/shower Additional Comments: Pt lives with husband who works from home. Son also lives next door and works from home. Very  supportive family.    Prior Function Level of Independence: Independent         Comments: Pt independent up until last 1-2 months when she started to have word finding problems, mild cognitive deficits and then the onset of  R side weakness.     Hand  Dominance   Dominant Hand: Right    Extremity/Trunk Assessment   Upper Extremity Assessment: Defer to OT evaluation RUE Deficits / Details: Strength now appears 4+/5.  Very functional.  Pt with mild corrdination deficits in RUE.     RUE Sensation: decreased light touch     Lower Extremity Assessment: RLE deficits/detail RLE Deficits / Details: Strength grossly 4+/5.  Very functional.  Pt with mild corrdination deficits in RLE.      Cervical / Trunk Assessment: Other exceptions  Communication   Communication: Expressive difficulties  Cognition Arousal/Alertness: Awake/alert Behavior During Therapy: WFL for tasks assessed/performed Overall Cognitive Status: Impaired/Different from baseline Area of Impairment: Memory;Awareness     Memory: Decreased short-term memory     Awareness: Emergent   General Comments: Pt with mild memory deficits starting a few months ago.      General Comments General comments (skin integrity, edema, etc.): Pt motivated to be independent and return home.    Exercises General Exercises - Lower Extremity Ankle Circles/Pumps: AROM;Both;10 reps;Seated Straight Leg Raises: AROM;Both;10 reps;Seated      Assessment/Plan    PT Assessment Patient needs continued PT services  PT Diagnosis Difficulty walking;Hemiplegia dominant side   PT Problem List Decreased strength;Decreased activity tolerance;Decreased balance;Decreased coordination;Decreased cognition;Decreased knowledge of use of DME;Decreased safety awareness;Pain  PT Treatment Interventions DME instruction;Gait training;Stair training;Functional mobility training;Therapeutic activities;Therapeutic exercise;Balance training;Neuromuscular re-education;Cognitive remediation;Patient/family education   PT Goals (Current goals can be found in the Care Plan section) Acute Rehab PT Goals Patient Stated Goal: to be back home independent PT Goal Formulation: With patient/family Time For Goal Achievement:  09/19/15 Potential to Achieve Goals: Good    Frequency Min 3X/week   Barriers to discharge Inaccessible home environment Steps to enter home    Co-evaluation PT/OT/SLP Co-Evaluation/Treatment: Yes Reason for Co-Treatment: For patient/therapist safety PT goals addressed during session: Mobility/safety with mobility;Balance;Strengthening/ROM OT goals addressed during session: ADL's and self-care       End of Session Equipment Utilized During Treatment: Gait belt Activity Tolerance: Patient limited by fatigue;Patient tolerated treatment well Patient left: in chair;with call bell/phone within reach;with chair alarm set;with family/visitor present Nurse Communication: Mobility status         Time: 1030-1104 PT Time Calculation (min) (ACUTE ONLY): 34 min   Charges:   PT Evaluation $PT Eval Moderate Complexity: 1 Procedure     PT G Codes:       Susan Clark PT, DPT  Pager: [phone] Phone: [phone] 09/05/2015, 1:29 PM

## 2015-09-05 NOTE — Care Management Note (Signed)
Case Management Note  Patient Details  Name: Susan Clark MRN: [id number] Date of Birth: 05/27/1929  Subjective/Objective:  Date: 09/05/15 Spoke with patient at the bedside along with children.  Introduced self as Tourist information centre manager and explained role in discharge planning and how to be reached.  Verified patient lives in [place] with spouse . Has a cane a rolling walker and a bsc. Expressed potential need for no other DME. Verified patient anticipates to go home with family, at time of discharge and will have full-time supervision by family at this time to best of their knowledge. Patient denied needing help with their medication.  Patient is driven by family to MD appointments.  Verified patient has PCP Jerilynn Birkenhead. Patient has transportation at discharge. Per pt/ot /st eval rec outpatient services. NCM made referral to Outpatient neuro rehabilitation center.   Plan: CM will continue to follow for discharge planning and [facility] resources.                   Action/Plan:   Expected Discharge Date:                  Expected Discharge Plan:  Home/Self Care  In-House Referral:     Discharge planning Services  CM Consult  Post Acute Care Choice:    Choice offered to:     DME Arranged:    DME Agency:     HH Arranged:    [place] Agency:     Status of Service:  Completed, signed off  Medicare Important Message Given:    Date Medicare IM Given:    Medicare IM give by:    Date Additional Medicare IM Given:    Additional Medicare Important Message give by:     If discussed at [place] of Stay Meetings, dates discussed:    Additional Comments:  Zenon Mayo, RN 09/05/2015, 3:11 PM

## 2015-09-05 NOTE — Progress Notes (Signed)
Key Points: Use following P&T approved IV to PO antibiotic change policy.  Description contains the criteria that are approved Note: Policy Excludes:  Esophagectomy patientsPHARMACIST - PHYSICIAN COMMUNICATION  CONCERNING: IV to Oral Route Change Policy  RECOMMENDATION: This patient is receiving Keppra by the intravenous route.  Based on criteria approved by the Pharmacy and Therapeutics Committee, the intravenous medication(s) is/are being converted to the equivalent oral dose form(s).   DESCRIPTION: These criteria include:  The patient is eating (either orally or via tube) and/or has been taking other orally administered medications for a least 24 hours  The patient has no evidence of active gastrointestinal bleeding or impaired GI absorption (gastrectomy, short bowel, patient on TNA or NPO).  If you have questions about this conversion, please contact the Pharmacy Department  []   [phone] )  [facility] []   [phone] )  [facility] [x]   [phone] )  [facility] []   [phone] )  [facility] []   [phone] )  [place], [place] D 09/05/2015 1:35 PM

## 2015-09-05 NOTE — Progress Notes (Signed)
  Radiation Oncology         [phone]) [phone] ________________________________  Name: Susan Clark MRN: [id number]  Date: 09/04/2015  DOB: 1929-03-20  Chart Note:  I spoke with Dr. Ronnald Ramp.  This 82 yo patient has a 3 cm left occipital brain lesion which appears consistent with stage IV metastatic melanoma.  I will see her and talk about options including treatment.    ________________________________  Sheral Apley. Tammi Klippel, M.D.

## 2015-09-06 DIAGNOSIS — R935 Abnormal findings on diagnostic imaging of other abdominal regions, including retroperitoneum: Secondary | ICD-10-CM

## 2015-09-06 DIAGNOSIS — R918 Other nonspecific abnormal finding of lung field: Secondary | ICD-10-CM

## 2015-09-06 DIAGNOSIS — I480 Paroxysmal atrial fibrillation: Secondary | ICD-10-CM

## 2015-09-06 DIAGNOSIS — D72829 Elevated white blood cell count, unspecified: Secondary | ICD-10-CM

## 2015-09-06 DIAGNOSIS — G939 Disorder of brain, unspecified: Secondary | ICD-10-CM

## 2015-09-06 DIAGNOSIS — R599 Enlarged lymph nodes, unspecified: Secondary | ICD-10-CM

## 2015-09-06 LAB — BASIC METABOLIC PANEL
Anion gap: 11 (ref 5–15)
BUN: 13 mg/dL (ref 6–20)
CALCIUM: 9.4 mg/dL (ref 8.9–10.3)
CO2: 27 mmol/L (ref 22–32)
CREATININE: 0.71 mg/dL (ref 0.44–1.00)
Chloride: 92 mmol/L — ABNORMAL LOW (ref 101–111)
GLUCOSE: 173 mg/dL — AB (ref 65–99)
Potassium: 3.7 mmol/L (ref 3.5–5.1)
Sodium: 130 mmol/L — ABNORMAL LOW (ref 135–145)

## 2015-09-06 LAB — CBC
HEMATOCRIT: 44.5 % (ref 36.0–46.0)
Hemoglobin: 14.6 g/dL (ref 12.0–15.0)
MCH: 24.3 pg — ABNORMAL LOW (ref 26.0–34.0)
MCHC: 32.8 g/dL (ref 30.0–36.0)
MCV: 74 fL — AB (ref 78.0–100.0)
PLATELETS: 247 10*3/uL (ref 150–400)
RBC: 6.01 MIL/uL — ABNORMAL HIGH (ref 3.87–5.11)
RDW: 15.4 % (ref 11.5–15.5)
WBC: 30.1 10*3/uL — ABNORMAL HIGH (ref 4.0–10.5)

## 2015-09-06 LAB — C DIFFICILE QUICK SCREEN W PCR REFLEX
C DIFFICILE (CDIFF) INTERP: NEGATIVE
C DIFFICLE (CDIFF) ANTIGEN: NEGATIVE
C Diff toxin: NEGATIVE

## 2015-09-06 MED ORDER — LOPERAMIDE HCL 2 MG PO CAPS: 2.0000 mg | ORAL_CAPSULE | Freq: Three times a day (TID) | ORAL | Status: DC | PRN

## 2015-09-06 MED ORDER — ZOLPIDEM TARTRATE 5 MG PO TABS
5.0000 mg | ORAL_TABLET | Freq: Every evening | ORAL | Status: DC | PRN
  Administered 2015-09-06 – 2015-09-07 (×2): 5 mg via ORAL
  Filled 2015-09-06 (×2): qty 1

## 2015-09-06 MED ORDER — SACCHAROMYCES BOULARDII 250 MG PO CAPS
250.0000 mg | ORAL_CAPSULE | Freq: Two times a day (BID) | ORAL | Status: DC
  Administered 2015-09-06 – 2015-09-08 (×5): 250 mg via ORAL
  Filled 2015-09-06 (×5): qty 1

## 2015-09-06 NOTE — Progress Notes (Signed)
Charday PRUDIE NESTOR is a 82 y.o. female patient who transferred  from 3S awake, alert  & orientated  X 3, Full Code, VSS - Blood pressure 163/73, pulse 56, temperature 97.5 F (36.4 C), temperature source Oral, resp. rate 14, height 5\' 5"  (1.651 m), weight 62.5 kg (137 lb 12.6 oz), SpO2 93 %. on room air, no c/o shortness of breath, no c/o chest pain, no distress noted. Tele # 22 placed and pt is currently running:normal sinus rhythm.   IV site WDL: hand right, condition patent and no redness with a transparent dsg that's clean dry and intact.  Allergies:   Allergies  Allergen Reactions  . Buprenorphine Hcl Other (See Comments)    Make her see things that are not there and makes her nausea   . Morphine And Related     Make her see things that are not there and makes her nausea   . Prednisone Other (See Comments)    Made her hyper and could not sleep      Past Medical History  Diagnosis Date  . Palpitations     occasional  . Chronic back pain   . Hypertension     Pt orientation to unit, room and routine. SR up x 3, fall risk assessment complete with Patient and family verbalizing understanding of risks associated with falls. Pt verbalizes an understanding of how to use the call bell and to call for help before getting out of bed.  Skin, clean-dry- intact bruise on leg and buttocks .    Will cont to monitor and assist as needed.  Charlynn Grimes, RN 09/06/2015 9:02 PM

## 2015-09-06 NOTE — Progress Notes (Signed)
[facility] PROGRESS NOTE  Susan Clark [id number] DOB: 1928/10/16 DOA: 09/04/2015  PCP: Stephens Shire, MD  Brief HPI: 82 year old Caucasian female with a past medical history of coronary artery disease, hypertension, hyperlipidemia, presented with complaints of weight loss, generalized weakness and fatigue over the past few weeks. Evaluation in the emergency department revealed leukocytosis. CT scan showed left-sided vasogenic edema. MRI brain was done which showed left-sided mass with midline shift. Patient was hospitalized for further management. She subsequently developed atrial fibrillation with RVR.  Past medical history:  Past Medical History  Diagnosis Date  . Palpitations     occasional  . Chronic back pain   . Hypertension     Consultants: Neurosurgery. Radiation oncology. Phone discussion with Dr. Alen Blew.  Procedures:  Echocardiogram is pending  Antibiotics: None  Subjective: Patient denies any headaches currently. No visual disturbances. No chest pain or shortness of breath. No nausea, vomiting. Her son is at the bedside. Patient did develop episodes of diarrhea overnight. Denies any abdominal pain.  Objective: Vital Signs  Filed Vitals:   09/06/15 0400 09/06/15 0600 09/06/15 0700 09/06/15 1105  BP: 113/37 140/53 142/56 155/60  Pulse: 50 52 56 61  Temp: 97.3 F (36.3 C)  97.8 F (36.6 C) 98 F (36.7 C)  TempSrc: Oral  Oral Oral  Resp: 14 16 20 20   Height:      Weight:      SpO2: 97% 96% 94% 99%    Intake/Output Summary (Last 24 hours) at 09/06/15 1231 Last data filed at 09/06/15 1106  Gross per 24 hour  Intake  28.33 ml  Output    800 ml  Net -771.67 ml   Filed Weights   09/04/15 1141 09/04/15 2352  Weight: 63.192 kg (139 lb 5 oz) 62.5 kg (137 lb 12.6 oz)    General appearance: alert, cooperative, appears stated age and no distress Resp: clear to auscultation bilaterally Cardio: S1, S2 is normal, regular. Premature beats  present. No S3, S4. No rubs, murmurs or bruit. GI: soft, non-tender; bowel sounds normal; no masses,  no organomegaly Extremities: extremities normal, atraumatic, no cyanosis or edema Neurologic: Awake and alert. Oriented 3. No facial asymmetry. Motor strength equal bilateral upper and lower extremities.  Lab Results:  Basic Metabolic Panel:  Recent Labs Lab 09/04/15 1140 09/04/15 1145 09/05/15 0518 09/06/15 0952  NA 131* 129* 129* 130*  K 3.9 3.6 3.6 3.7  CL 91* 88* 96* 92*  CO2 27  --  24 27  GLUCOSE 99 101* 131* 173*  BUN 6 6 7 13   CREATININE 0.58 0.50 0.58 0.71  CALCIUM 9.3  --  8.9 9.4   Liver Function Tests:  Recent Labs Lab 09/04/15 1140  AST 19  ALT 12*  ALKPHOS 60  BILITOT 0.8  PROT 6.9  ALBUMIN 3.6   CBC:  Recent Labs Lab 09/04/15 1140 09/04/15 1145 09/05/15 0518 09/06/15 0952  WBC 24.0*  --  20.8* 30.1*  NEUTROABS 10.8*  --   --   --   HGB 15.1* 16.0* 14.3 14.6  HCT 44.5 47.0* 42.8 44.5  MCV 74.4*  --  74.0* 74.0*  PLT 212  --  202 247    Recent Results (from the past 240 hour(s))  Culture, blood (Routine X 2) w Reflex to ID Panel     Status: None (Preliminary result)   Collection Time: 09/04/15  3:42 PM  Result Value Ref Range Status   Specimen Description BLOOD LEFT ANTECUBITAL  Final  Special Requests BOTTLES DRAWN AEROBIC AND ANAEROBIC 5CC  Final   Culture NO GROWTH < 24 HOURS  Final   Report Status PENDING  Incomplete  Culture, blood (Routine X 2) w Reflex to ID Panel     Status: None (Preliminary result)   Collection Time: 09/04/15  3:47 PM  Result Value Ref Range Status   Specimen Description BLOOD LEFT FOREARM  Final   Special Requests BOTTLES DRAWN AEROBIC AND ANAEROBIC 5CC  Final   Culture NO GROWTH < 24 HOURS  Final   Report Status PENDING  Incomplete  MRSA PCR Screening     Status: None   Collection Time: 09/05/15 12:40 AM  Result Value Ref Range Status   MRSA by PCR NEGATIVE NEGATIVE Final    Comment:        The  GeneXpert MRSA Assay (FDA approved for NASAL specimens only), is one component of a comprehensive MRSA colonization surveillance program. It is not intended to diagnose MRSA infection nor to guide or monitor treatment for MRSA infections.   C difficile quick scan w PCR reflex     Status: None   Collection Time: 09/06/15  6:40 AM  Result Value Ref Range Status   C Diff antigen NEGATIVE NEGATIVE Final   C Diff toxin NEGATIVE NEGATIVE Final   C Diff interpretation Negative for toxigenic C. difficile  Final      Studies/Results: Dg Chest 2 View  09/04/2015  CLINICAL DATA:  Leukocytosis. Severe vasogenic edema in the left cerebral hemisphere. EXAM: CHEST  2 VIEW COMPARISON:  09/18/2007 FINDINGS: Scattered pulmonary nodules are present. A mass at the right lung base measures 3.1 cm in diameter. Appearance favoring pulmonary metastatic disease bilaterally. Atherosclerotic aortic arch. IMPRESSION: 1. Numerous scattered bilateral pulmonary nodules and a right basilar pulmonary mass. Appearance favors metastatic disease to both lungs, especially in light of the vasogenic edema in the brain. 2. Atherosclerotic aortic arch. Electronically Signed   By: Van Clines M.D.   On: 09/04/2015 15:22   Ct Chest W Contrast  09/05/2015  CLINICAL DATA:  Patient with history of metastatic melanoma. Recently diagnosed with brain metastasis. EXAM: CT CHEST, ABDOMEN, AND PELVIS WITH CONTRAST TECHNIQUE: Multidetector CT imaging of the chest, abdomen and pelvis was performed following the standard protocol during bolus administration of intravenous contrast. CONTRAST:  137mL OMNIPAQUE IOHEXOL 300 MG/ML  SOLN COMPARISON:  None. FINDINGS: CT CHEST FINDINGS Mediastinum/Lymph Nodes: Visualized thyroid is unremarkable. No axillary lymphadenopathy. Normal heart size. No pericardial effusion. There is a 1.4 cm precarinal lymph node (image 22; series 2). Lungs/Pleura: Central airways are patent. Innumerable pulmonary nodules  are demonstrated throughout the lungs bilaterally. Reference left lower lobe nodule measures 1.7 cm (image 46; series 3). Reference right middle lobe nodule measures 2.6 cm (image 42; series 3). No pleural effusion or pneumothorax. Musculoskeletal: No aggressive or acute appearing osseous lesions. CT ABDOMEN PELVIS FINDINGS Hepatobiliary: Liver is normal in size and contour. No definite focal lesion identified. Within the gallbladder lumen there is a 2.1 cm soft tissue mass. No gallbladder wall thickening or pericholecystic fluid. Pancreas: Unremarkable Spleen: Unremarkable Adrenals/Urinary Tract: Adrenal glands are normal. Malrotation of the left kidney. Too small to characterize low-attenuation lesions within the left and right kidneys. No hydronephrosis. Urinary bladder is unremarkable. Stomach/Bowel: Sigmoid colonic diverticulosis. No CT evidence for acute diverticulitis. No free fluid or free intraperitoneal air. Vascular/Lymphatic: Tortuous yet normal caliber abdominal aorta. Peripheral calcified atherosclerotic plaque. No retroperitoneal lymphadenopathy. Other: Uterus is surgically absent. Small benign-appearing left adnexal  cystic lesion. Musculoskeletal: Patchy lucencies within the superior aspect of the right and left ilium. Rightward curvature of the lumbar spine with associated degenerative disease. There is a 6.1 x 4.8 cm enhancing soft tissue mass within the right paraspinous musculature (image 76; series 2). IMPRESSION: Findings compatible with metastatic disease including innumerable bilateral pulmonary nodules, mediastinal adenopathy and right paraspinous soft tissue mass. Nonspecific lucencies within the right and left ilium may represent metastatic disease. Soft tissue mass within the gallbladder lumen may represent stone or potentially metastatic disease. Electronically Signed   By: Lovey Newcomer M.D.   On: 09/05/2015 13:03   Mr Jeri Cos [id number] Contrast  09/04/2015  CLINICAL DATA:  Slowly progressive  visual loss, difficulty with cognition, and RIGHT-sided weakness. History of melanoma resection LEFT neck 2 years ago. EXAM: MRI HEAD WITHOUT AND WITH CONTRAST TECHNIQUE: Multiplanar, multiecho pulse sequences of the brain and surrounding structures were obtained without and with intravenous contrast. CONTRAST:  56mL MULTIHANCE GADOBENATE DIMEGLUMINE 529 MG/ML IV SOLN COMPARISON:  CT head earlier today. FINDINGS: No acute stroke, hydrocephalus, or extra-axial fluid. As predicted from CT, there is an intracranial mass contributing to vasogenic edema. The mass is solitary, located in the LEFT occipital lobe, superficial, but intra-axial. There is T1 hyperintensity precontrast with avid enhancement postcontrast. The lesion measures 33 x 31 x 29 mm, and does share an inferior border with the LEFT transverse sinus. Overall the mass is hypointense on T2 weighted imaging. Gradient sequence does not demonstrate acute hemorrhage, but a few prominent vessels are noted. Early uncal herniation is noted, with LEFT-to-RIGHT shift of 7 mm at the anterior third ventricle level. Normal for age cerebral volume. Minor white matter disease. No hydrocephalus or ventricular trapping. No tonsillar herniation. Extracranial soft tissues unremarkable. IMPRESSION: Solitary, greater than 3 cm, LEFT occipital intra-axial metastasis, demonstrating precontrast T1 shortening consistent with metastatic melanoma. Avid postcontrast enhancement and prominent intratumoral vessels suggest a highly vascular lesion. Extensive vasogenic edema with early LEFT uncal herniation and 7 mm LEFT-to-RIGHT shift. Electronically Signed   By: Staci Righter M.D.   On: 09/04/2015 19:10   Ct Abdomen Pelvis W Contrast  09/05/2015  CLINICAL DATA:  Patient with history of metastatic melanoma. Recently diagnosed with brain metastasis. EXAM: CT CHEST, ABDOMEN, AND PELVIS WITH CONTRAST TECHNIQUE: Multidetector CT imaging of the chest, abdomen and pelvis was performed  following the standard protocol during bolus administration of intravenous contrast. CONTRAST:  158mL OMNIPAQUE IOHEXOL 300 MG/ML  SOLN COMPARISON:  None. FINDINGS: CT CHEST FINDINGS Mediastinum/Lymph Nodes: Visualized thyroid is unremarkable. No axillary lymphadenopathy. Normal heart size. No pericardial effusion. There is a 1.4 cm precarinal lymph node (image 22; series 2). Lungs/Pleura: Central airways are patent. Innumerable pulmonary nodules are demonstrated throughout the lungs bilaterally. Reference left lower lobe nodule measures 1.7 cm (image 46; series 3). Reference right middle lobe nodule measures 2.6 cm (image 42; series 3). No pleural effusion or pneumothorax. Musculoskeletal: No aggressive or acute appearing osseous lesions. CT ABDOMEN PELVIS FINDINGS Hepatobiliary: Liver is normal in size and contour. No definite focal lesion identified. Within the gallbladder lumen there is a 2.1 cm soft tissue mass. No gallbladder wall thickening or pericholecystic fluid. Pancreas: Unremarkable Spleen: Unremarkable Adrenals/Urinary Tract: Adrenal glands are normal. Malrotation of the left kidney. Too small to characterize low-attenuation lesions within the left and right kidneys. No hydronephrosis. Urinary bladder is unremarkable. Stomach/Bowel: Sigmoid colonic diverticulosis. No CT evidence for acute diverticulitis. No free fluid or free intraperitoneal air. Vascular/Lymphatic: Tortuous yet normal caliber abdominal  aorta. Peripheral calcified atherosclerotic plaque. No retroperitoneal lymphadenopathy. Other: Uterus is surgically absent. Small benign-appearing left adnexal cystic lesion. Musculoskeletal: Patchy lucencies within the superior aspect of the right and left ilium. Rightward curvature of the lumbar spine with associated degenerative disease. There is a 6.1 x 4.8 cm enhancing soft tissue mass within the right paraspinous musculature (image 76; series 2). IMPRESSION: Findings compatible with metastatic  disease including innumerable bilateral pulmonary nodules, mediastinal adenopathy and right paraspinous soft tissue mass. Nonspecific lucencies within the right and left ilium may represent metastatic disease. Soft tissue mass within the gallbladder lumen may represent stone or potentially metastatic disease. Electronically Signed   By: Lovey Newcomer M.D.   On: 09/05/2015 13:03    Medications:  Scheduled: . dexamethasone  4 mg Intravenous 4 times per day  . feeding supplement  1 Container Oral BID BM  . levETIRAcetam  500 mg Oral BID  . pantoprazole  40 mg Oral BID  . propranolol  40 mg Oral BID  . simvastatin  20 mg Oral QHS  . sodium chloride flush  3 mL Intravenous Q12H   Continuous:  [id number] **OR** acetaminophen, albuterol, nitroGLYCERIN, ondansetron **OR** ondansetron (ZOFRAN) IV, zolpidem  Assessment/Plan:  Active Problems:   Ischemic heart disease   Vasogenic brain edema (HCC)   Leucocytosis   Metastatic melanoma of brain (HCC)    Left brain mass, possible metastatic melanoma Patient did have a midline shift. Vasogenic edema was present. Neurosurgery is following. Patient is on dexamethasone. Patient is also on Keppra for seizure prophylaxis. Patient has been seen by radiation oncology and plan is for radiation treatment.  Metastatic disease/Recent weight loss and generalized weakness the setting of previous history of melanoma Most likely secondary to the above. CT scan of the abdomen pelvis shows evidence for multiple pulmonary nodules, mediastinal adenopathy, paraspinal soft tissue mass and lesions in the pelvic bone. This is all suggestive of a metastatic process. Patient underwent biopsy of her skin lesion a few days ago. Results are not available quite yet. Findings discussed with Dr. Alen Blew with oncology. He agrees that the current plan should be to address the brain lesion. He will be happy to see the patient in his office in the near future to discuss the other  findings.  Atrial fibrillation with RVR. Unfortunately, no EKG documenting atrial fibrillation is available. Patient was on Cardizem infusion for a brief time. She converted to sinus rhythm. So this appears to be paroxysmal atrial fibrillation. She does not have history of same. Echocardiogram is pending. TSH is normal. Chads2vasc score is at least 5. Due to presence of brain tumor, which is thought to be metastatic melanoma, the risk of bleeding is extremely high with anticoagulation. Her atrial fibrillation was transient. Follow-up on echocardiogram. Hold off on anticoagulation for now. This was discussed with the patient and her son. Continue propranolol.  History of coronary artery disease. She had intervention in 2004, to her LAD. She had a drug-eluting stent placed. She had a stress test in 2009, which did not show any evidence for ischemia. Mild aortic insufficiency was noted in 2013 along with mild mitral regurgitation. Should be okay to resume aspirin soon. We'll discuss with neurosurgery.  Insomnia Continue with Ambien  Leukocytosis Detected as outpatient. Etiology is unclear. No obvious infectious source identified. Counts are higher today, most likely due to steroids. Continue to monitor for now.  Acute diarrhea. Stool studies negative for C. difficile. Monitor closely.  Essential hypertension. Monitor blood pressures closely.  DVT  Prophylaxis: SCDs    Code Status: Full code  Family Communication: Discussed with the patient and her son  Disposition Plan: Should be okay for transfer to telemetry    LOS: 2 days   [facility] Pager [phone] 09/06/2015, 12:31 PM  If 7PM-7AM, please contact night-coverage at [email or web address], password [facility]

## 2015-09-06 NOTE — Progress Notes (Signed)
Patient ID: Susan Clark, female   DOB: 02-Feb-1929, 82 y.o.   MRN: [id number] Patient doing well no significant headache improved on steroids  Awake alert oriented strength 5 out of 5 with no pronator drift.  Radiation evaluation possible simulation on Monday agree with medical management.

## 2015-09-06 NOTE — Progress Notes (Signed)
Report called to [place], Therapist, sports. All questions answered, will transfer to 5W28.

## 2015-09-06 NOTE — Progress Notes (Signed)
Report received from [place], [place] from [place]. Pt is to be admitted in 5W28. Awaiting pt's arrival.

## 2015-09-07 ENCOUNTER — Inpatient Hospital Stay (HOSPITAL_COMMUNITY): Payer: PPO

## 2015-09-07 DIAGNOSIS — G936 Cerebral edema: Secondary | ICD-10-CM

## 2015-09-07 DIAGNOSIS — I4891 Unspecified atrial fibrillation: Secondary | ICD-10-CM

## 2015-09-07 LAB — ECHOCARDIOGRAM COMPLETE
Height: 65 in
Weight: 2204.6 oz

## 2015-09-07 LAB — BASIC METABOLIC PANEL
ANION GAP: 11 (ref 5–15)
BUN: 13 mg/dL (ref 6–20)
CHLORIDE: 91 mmol/L — AB (ref 101–111)
CO2: 28 mmol/L (ref 22–32)
Calcium: 9.3 mg/dL (ref 8.9–10.3)
Creatinine, Ser: 0.73 mg/dL (ref 0.44–1.00)
GFR calc Af Amer: >60 mL/min (ref >60)
GFR calc non Af Amer: >60 mL/min (ref >60)
GLUCOSE: 135 mg/dL — AB (ref 65–99)
POTASSIUM: 3.8 mmol/L (ref 3.5–5.1)
Sodium: 130 mmol/L — ABNORMAL LOW (ref 135–145)

## 2015-09-07 LAB — CBC
HEMATOCRIT: 45 % (ref 36.0–46.0)
HEMOGLOBIN: 14.9 g/dL (ref 12.0–15.0)
MCH: 24.5 pg — AB (ref 26.0–34.0)
MCHC: 33.1 g/dL (ref 30.0–36.0)
MCV: 74 fL — AB (ref 78.0–100.0)
Platelets: 239 10*3/uL (ref 150–400)
RBC: 6.08 MIL/uL — AB (ref 3.87–5.11)
RDW: 15.6 % — ABNORMAL HIGH (ref 11.5–15.5)
WBC: 29.7 10*3/uL — AB (ref 4.0–10.5)

## 2015-09-07 MED ORDER — HYDRALAZINE HCL 20 MG/ML IJ SOLN: 5.0000 mg | Freq: Four times a day (QID) | INTRAMUSCULAR | Status: DC | PRN

## 2015-09-07 MED ORDER — AMLODIPINE BESYLATE 5 MG PO TABS
5.0000 mg | ORAL_TABLET | Freq: Every day | ORAL | Status: DC
  Administered 2015-09-07 – 2015-09-08 (×2): 5 mg via ORAL
  Filled 2015-09-07 (×2): qty 1

## 2015-09-07 NOTE — Progress Notes (Signed)
  Echocardiogram 2D Echocardiogram has been performed.  Diamond Nickel 09/07/2015, 11:57 AM

## 2015-09-07 NOTE — Progress Notes (Signed)
[facility] PROGRESS NOTE  REBBECA Clark [id number] DOB: [birth year] DOA: 09/04/2015  PCP: Stephens Shire, MD  Brief HPI: 82 year old Caucasian female with a past medical history of coronary artery disease, hypertension, hyperlipidemia, presented with complaints of weight loss, generalized weakness and fatigue over the past few weeks. Evaluation in the emergency department revealed leukocytosis. CT scan showed left-sided vasogenic edema. MRI brain was done which showed left-sided mass with midline shift. Patient was hospitalized for further management. She subsequently developed atrial fibrillation with RVR.  Past medical history:  Past Medical History  Diagnosis Date  . Palpitations     occasional  . Chronic back pain   . Hypertension     Consultants: Neurosurgery. Radiation oncology. Phone discussion with Dr. Alen Blew.  Procedures:  Echocardiogram is pending  Antibiotics: None  Subjective: Patient complains of occasional headache. She was experiencing some shoulder and neck pain earlier this morning but that has resolved. Otherwise, feels well.   Objective: Vital Signs  Filed Vitals:   09/06/15 2000 09/06/15 2054 09/07/15 0454 09/07/15 0522  BP: 156/99 163/73 179/99 164/68  Pulse: 58 56 82   Temp:  97.5 F (36.4 C) 97.5 F (36.4 C)   TempSrc:  Oral Oral   Resp: 14  16   Height:      Weight:      SpO2: 96% 93% 88% 94%    Intake/Output Summary (Last 24 hours) at 09/07/15 1322 Last data filed at 09/07/15 1034  Gross per 24 hour  Intake    430 ml  Output      0 ml  Net    430 ml   Filed Weights   09/04/15 1141 09/04/15 2352  Weight: 63.192 kg (139 lb 5 oz) 62.5 kg (137 lb 12.6 oz)    General appearance: alert, cooperative, appears stated age and no distress Resp: clear to auscultation bilaterally Cardio: S1, S2 is normal, regular. Premature beats present. No S3, S4. No rubs, murmurs or bruit. GI: soft, non-tender; bowel sounds normal; no masses,   no organomegaly Extremities: extremities normal, atraumatic, no cyanosis or edema Neurologic: Awake and alert. Oriented 3. No facial asymmetry. Motor strength equal bilateral upper and lower extremities.  Lab Results:  Basic Metabolic Panel:  Recent Labs Lab 09/04/15 1140 09/04/15 1145 09/05/15 0518 09/06/15 0952 09/07/15 0727  NA 131* 129* 129* 130* 130*  K 3.9 3.6 3.6 3.7 3.8  CL 91* 88* 96* 92* 91*  CO2 27  --  24 27 28   GLUCOSE 99 101* 131* 173* 135*  BUN 6 6 7 13 13   CREATININE 0.58 0.50 0.58 0.71 0.73  CALCIUM 9.3  --  8.9 9.4 9.3   Liver Function Tests:  Recent Labs Lab 09/04/15 1140  AST 19  ALT 12*  ALKPHOS 60  BILITOT 0.8  PROT 6.9  ALBUMIN 3.6   CBC:  Recent Labs Lab 09/04/15 1140 09/04/15 1145 09/05/15 0518 09/06/15 0952 09/07/15 0727  WBC 24.0*  --  20.8* 30.1* 29.7*  NEUTROABS 10.8*  --   --   --   --   HGB 15.1* 16.0* 14.3 14.6 14.9  HCT 44.5 47.0* 42.8 44.5 45.0  MCV 74.4*  --  74.0* 74.0* 74.0*  PLT 212  --  202 247 239    Recent Results (from the past 240 hour(s))  Culture, blood (Routine X 2) w Reflex to ID Panel     Status: None (Preliminary result)   Collection Time: 09/04/15  3:42 PM  Result Value Ref  Range Status   Specimen Description BLOOD LEFT ANTECUBITAL  Final   Special Requests BOTTLES DRAWN AEROBIC AND ANAEROBIC 5CC  Final   Culture NO GROWTH 3 DAYS  Final   Report Status PENDING  Incomplete  Culture, blood (Routine X 2) w Reflex to ID Panel     Status: None (Preliminary result)   Collection Time: 09/04/15  3:47 PM  Result Value Ref Range Status   Specimen Description BLOOD LEFT FOREARM  Final   Special Requests BOTTLES DRAWN AEROBIC AND ANAEROBIC 5CC  Final   Culture NO GROWTH 3 DAYS  Final   Report Status PENDING  Incomplete  MRSA PCR Screening     Status: None   Collection Time: 09/05/15 12:40 AM  Result Value Ref Range Status   MRSA by PCR NEGATIVE NEGATIVE Final    Comment:        The GeneXpert MRSA Assay  (FDA approved for NASAL specimens only), is one component of a comprehensive MRSA colonization surveillance program. It is not intended to diagnose MRSA infection nor to guide or monitor treatment for MRSA infections.   C difficile quick scan w PCR reflex     Status: None   Collection Time: 09/06/15  6:40 AM  Result Value Ref Range Status   C Diff antigen NEGATIVE NEGATIVE Final   C Diff toxin NEGATIVE NEGATIVE Final   C Diff interpretation Negative for toxigenic C. difficile  Final      Studies/Results: No results found.  Medications:  Scheduled: . amLODipine  5 mg Oral Daily  . dexamethasone  4 mg Intravenous 4 times per day  . feeding supplement  1 Container Oral BID BM  . levETIRAcetam  500 mg Oral BID  . pantoprazole  40 mg Oral BID  . propranolol  40 mg Oral BID  . saccharomyces boulardii  250 mg Oral BID  . simvastatin  20 mg Oral QHS  . sodium chloride flush  3 mL Intravenous Q12H   Continuous:  [id number] **OR** acetaminophen, hydrALAZINE, loperamide, nitroGLYCERIN, ondansetron **OR** ondansetron (ZOFRAN) IV, zolpidem  Assessment/Plan:  Active Problems:   Ischemic heart disease   Vasogenic brain edema (HCC)   Leucocytosis   Metastatic melanoma of brain (HCC)   PAF (paroxysmal atrial fibrillation) (HCC)    Left brain mass, possible metastatic melanoma Patient did have midline shift. Vasogenic edema was present. Neurosurgery is following. Patient is on dexamethasone. Patient is also on Keppra for seizure prophylaxis. Patient has been seen by radiation oncology and plan is for radiation treatment. Patient feels better overall.  Metastatic disease/Recent weight loss and generalized weakness the setting of previous history of melanoma CT scan of the abdomen pelvis shows evidence for multiple pulmonary nodules, mediastinal adenopathy, paraspinal soft tissue mass and lesions in the pelvic bone. This is all suggestive of a metastatic process. Patient  underwent biopsy of her skin lesion a few days ago at her dermatologist office. Results are not available quite yet. Findings discussed with Dr. Alen Blew with oncology. He agrees that the current plan should be to address the brain lesion. He will be happy to see the patient in his office in the near future to discuss the other findings.  Atrial fibrillation with RVR. Unfortunately, no EKG documenting atrial fibrillation is available. There is some telemetry readings suggesting A. fib but its actually sinus rhythm with PACs. Patient was on Cardizem infusion for a brief time for tachycardia, which was thought to be atrial fibrillation. She then converted to sinus rhythm. So this  appears to be paroxysmal atrial fibrillation. She does not have history of same. Echocardiogram is pending. TSH is normal. Chads2vasc score is at least 5. Due to presence of brain tumor, which is thought to be metastatic melanoma, the risk of bleeding is extremely high with anticoagulation. Her atrial fibrillation was transient and we are not [id number] certain that it was atrial fibrillation to begin with. Follow-up on echocardiogram. Hold off on anticoagulation for now. This was discussed with the patient and her husband. Continue propranolol. Patient is followed by cardiology in the office. She will need outpatient follow-up with them. Will discuss with neurosurgery if aspirin is a safe option.  History of coronary artery disease. She had intervention in 2004, to her LAD. She had a drug-eluting stent placed. She had a stress test in 2009, which did not show any evidence for ischemia. Mild aortic insufficiency was noted in 2013 along with mild mitral regurgitation.   Insomnia Continue with Ambien  Leukocytosis Detected as outpatient. Etiology is unclear. No obvious infectious source identified. Counts are higher most likely due to steroids. Continue to monitor for now.  Acute diarrhea. Stool studies negative for C. difficile. Monitor  closely.  Essential hypertension. Monitor blood pressures closely.  DVT Prophylaxis: SCDs    Code Status: Full code  Family Communication: Discussed with the patient and her husband Disposition Plan: Patient is stable. Plan is for radiation treatment simulation tomorrow. Discharge once cleared by neurosurgery and radiation oncology.    LOS: 3 days   [facility] Pager [phone] 09/07/2015, 1:22 PM  If 7PM-7AM, please contact night-coverage at [email or web address], password [facility]

## 2015-09-08 ENCOUNTER — Ambulatory Visit
Admit: 2015-09-08 | Discharge: 2015-09-08 | Disposition: A | Discharge status: Home / Self Care | Payer: PPO | Attending: Radiation Oncology | Admitting: Radiation Oncology

## 2015-09-08 ENCOUNTER — Ambulatory Visit: Payer: PPO

## 2015-09-08 DIAGNOSIS — C7931 Secondary malignant neoplasm of brain: Principal | ICD-10-CM

## 2015-09-08 DIAGNOSIS — C801 Malignant (primary) neoplasm, unspecified: Secondary | ICD-10-CM | POA: Insufficient documentation

## 2015-09-08 DIAGNOSIS — Z510 Encounter for antineoplastic radiation therapy: Secondary | ICD-10-CM | POA: Insufficient documentation

## 2015-09-08 LAB — PATHOLOGIST SMEAR REVIEW

## 2015-09-08 MED ORDER — ASPIRIN EC 325 MG PO TBEC: 325.0000 mg | DELAYED_RELEASE_TABLET | Freq: Every day | ORAL | Status: AC

## 2015-09-08 MED ORDER — DEXAMETHASONE 4 MG PO TABS: 4.0000 mg | ORAL_TABLET | Freq: Four times a day (QID) | ORAL | Status: AC

## 2015-09-08 MED ORDER — DEXAMETHASONE 4 MG PO TABS
4.0000 mg | ORAL_TABLET | Freq: Four times a day (QID) | ORAL | Status: DC
  Filled 2015-09-08 (×2): qty 1

## 2015-09-08 NOTE — Progress Notes (Signed)
Physical Therapy Treatment Patient Details Name: Susan Clark MRN: [id number] DOB: 02-11-1929 Today's Date: 09/08/2015    History of Present Illness Pt is an 82 yo female admitted with new onset weakness and dysmetria of the L side.  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  Pt to get CT this afternoon of lungs/abdomen/pelvis to r/o metastatic disease.    PT Comments    Pt performed increased gait distance and successful completion of stair training.  Pt required education on use of RW or cane at d/c until strength and balance improves.  Pt required cues for energy conservation.    Follow Up Recommendations  Outpatient PT;Supervision/Assistance - 24 hour     Equipment Recommendations  Cane    Recommendations for Other Services       Precautions / Restrictions Precautions Precautions: Fall Restrictions Weight Bearing Restrictions: No    Mobility  Bed Mobility Overal bed mobility: Needs Assistance Bed Mobility: Supine to Sit     Supine to sit: Supervision;HOB elevated     General bed mobility comments: HOB at 30 degrees, no cues or physical assist needed  Transfers Overall transfer level: Needs assistance Equipment used: None Transfers: Sit to/from Stand Sit to Stand: Min guard         General transfer comment: Pt required cues for safety, pt advance to standing without HHA.    Ambulation/Gait Ambulation/Gait assistance: Min assist Ambulation Distance (Feet): 250 Feet Assistive device: None Gait Pattern/deviations: Step-through pattern;Decreased stride length;Drifts right/left   Gait velocity interpretation: Below normal speed for age/gender General Gait Details: Pt required cues to improve reciprocal armswing to improve balance.  Pt performed with mild deviations of balance but no true LOB.     Stairs Stairs: Yes Stairs assistance: Min guard Stair Management: Two rails;Forwards Number of Stairs: 10 General stair comments: Cues for  safety.  Pt performed reciprocal pattern ascending and non reciprocal descending.  Pt required seated rest  break post stair training.    Wheelchair Mobility    Modified Rankin (Stroke Patients Only)       Balance     Sitting balance-Leahy Scale: Good       Standing balance-Leahy Scale: Fair Standing balance comment: Pt intermittently walking and furniture grabbing.                      Cognition Arousal/Alertness: Awake/alert Behavior During Therapy: WFL for tasks assessed/performed Overall Cognitive Status: Within Functional Limits for tasks assessed                      Exercises Other Exercises Other Exercises: provided yellow level theraputty with exercises handout.  provided demonstration of exercises and pt. able to return demo.      General Comments        Pertinent Vitals/Pain Pain Assessment: No/denies pain    Home Living                      Prior Function            PT Goals (current goals can now be found in the care plan section) Acute Rehab PT Goals Patient Stated Goal: to be back home independent Potential to Achieve Goals: Good Progress towards PT goals: Progressing toward goals    Frequency  Min 3X/week    PT Plan      Co-evaluation             End of  Session Equipment Utilized During Treatment: Gait belt Activity Tolerance: Patient limited by fatigue;Patient tolerated treatment well Patient left: in chair;with call bell/phone within reach;with chair alarm set;with family/visitor present     Time: [id number] PT Time Calculation (min) (ACUTE ONLY): 16 min  Charges:  $Gait Training: 8-22 mins                    G Codes:      Cristela Blue 09/29/2015, 3:25 PM  Governor Rooks, PTA pager [phone] Governor Rooks, PTA pager [phone]

## 2015-09-08 NOTE — Progress Notes (Signed)
  Radiation Oncology         [phone] ________________________________  Name: Susan Clark MRN: [id number]  Date: 09/08/2015  DOB: [birth year]  SIMULATION AND TREATMENT PLANNING NOTE    ICD-9-CM ICD-10-CM   1. Metastatic melanoma of brain ([place]) 198.3 C79.31     DIAGNOSIS:  82 year-old woman with a 3.3 cm left occipital brain lesion which appears consistent with stage IV metastatic melanoma  NARRATIVE:  The patient was brought to the [place].  Identity was confirmed.  All relevant records and images related to the planned course of therapy were reviewed.  The patient freely provided informed written consent to proceed with treatment after reviewing the details related to the planned course of therapy. The consent form was witnessed and verified by the simulation staff. Intravenous access was established for contrast administration. Then, the patient was set-up in a stable reproducible supine position for radiation therapy.  A relocatable thermoplastic stereotactic head frame was fabricated for precise immobilization.  CT images were obtained.  Surface markings were placed.  The CT images were loaded into the planning software and fused with the patient's targeting MRI scan.  Then the target and avoidance structures were contoured.  Treatment planning then occurred.  The radiation prescription was entered and confirmed.  I have requested 3D planning  I have requested a DVH of the following structures: Brain stem, brain, left eye, right eye, lenses, optic chiasm, target volumes, uninvolved brain, and normal tissue.    SPECIAL TREATMENT PROCEDURE:  The planned course of therapy using radiation constitutes a special treatment procedure. Special care is required in the management of this patient for the following reasons. This treatment constitutes a Special Treatment Procedure for the following reason: High dose per fraction requiring special monitoring for increased toxicities  of treatment including daily imaging.  The special nature of the planned course of radiotherapy will require increased physician supervision and oversight to ensure patient's safety with optimal treatment outcomes.  PLAN:  The patient will receive 24 Gy in 3 fractions.  ________________________________  Sheral Apley Tammi Klippel, M.D.  This document serves as a record of services personally performed by Tyler Pita, MD. It was created on his behalf by Arlyce Harman, a trained medical scribe. The creation of this record is based on the scribe's personal observations and the provider's statements to them. This document has been checked and approved by the attending provider.

## 2015-09-08 NOTE — Progress Notes (Signed)
Occupational Therapy Treatment Patient Details Name: ANYELI FIETZ MRN: [id number] DOB: 04-13-1929 Today's Date: 09/08/2015    History of present illness Pt is an 82 yo female admitted with new onset weakness and dysmetria of the L side.  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  Pt to get CT this afternoon of lungs/abdomen/pelvis to r/o metastatic disease.   OT comments  Pt. Provided with theraputty and exercise sheet.  Able to return demo of exercises and eager for use.  "these hands sure need to get stronger"  Will continue to follow acutely.    Follow Up Recommendations  Outpatient OT;Supervision/Assistance - 24 hour    Equipment Recommendations  None recommended by OT    Recommendations for Other Services Speech consult    Precautions / Restrictions Precautions Precautions: Fall       Mobility Bed Mobility                  Transfers                      Balance                                   ADL                                                Vision                     Perception     Praxis      Cognition   Behavior During Therapy: [facility] for tasks assessed/performed                         Extremity/Trunk Assessment               Exercises Other Exercises Other Exercises: provided yellow level theraputty with exercises handout.  provided demonstration of exercises and pt. able to return demo.     Shoulder Instructions       General Comments      Pertinent Vitals/ Pain       Pain Assessment: No/denies pain  Home Living                                          Prior Functioning/Environment              Frequency Min 2X/week     Progress Toward Goals  OT Goals(current goals can now be found in the care plan section)  Progress towards OT goals: Progressing toward goals     Plan Discharge plan remains appropriate     Co-evaluation                 End of Session     Activity Tolerance Patient tolerated treatment well   Patient Left in bed;with call bell/phone within reach;with family/visitor present   Nurse Communication          Time: [id number] OT Time Calculation (min): 13 min  Charges: OT General Charges $OT Visit: 1 Procedure OT Treatments $Self Care/Home Management : 8-22 mins  Janice Coffin, COTA/L 09/08/2015, 12:55  PM

## 2015-09-08 NOTE — Progress Notes (Signed)
Patient discharged home with family left floor via wheelchair accompanied by staff. Discharge instructions/ RX's and follow up appts  explained to patient and family verbalized understanding. No c/o pain or shortness of breath at discharge.  Rowen Hur, Tivis Ringer, RN

## 2015-09-08 NOTE — Discharge Summary (Signed)
[facility]  Physician Discharge Summary   Patient ID: Susan Clark MRN: [id number] DOB/AGE: [birth year] 82 y.o.  Admit date: 09/04/2015 Discharge date: 09/08/2015  PCP: Stephens Shire, MD  DISCHARGE DIAGNOSES:  Active Problems:   Ischemic heart disease   Vasogenic brain edema (HCC)   Leucocytosis   Metastatic melanoma of brain (HCC)   PAF (paroxysmal atrial fibrillation) (HCC)   RECOMMENDATIONS FOR OUTPATIENT FOLLOW UP: 1. Message sent to cardiology to arrange outpatient follow-up 2. Patient to be followed up by Dr. Shyrl Numbers with oncology 3. Patient to start radiation treatment. 4. Blood glucose level to be monitored as outpatient   DISCHARGE CONDITION: fair  Diet recommendation: Heart healthy  Filed Weights   09/04/15 1141 09/04/15 2352  Weight: 63.192 kg (139 lb 5 oz) 62.5 kg (137 lb 12.6 oz)    INITIAL HISTORY: 82 year old Caucasian female with a past medical history of coronary artery disease, hypertension, hyperlipidemia, presented with complaints of weight loss, generalized weakness and fatigue over the past few weeks. Evaluation in the emergency department revealed leukocytosis. CT scan showed left-sided vasogenic edema. MRI brain was done which showed left-sided mass with midline shift. Patient was hospitalized for further management. She subsequently developed atrial fibrillation with RVR.  Consultants: Neurosurgery. Radiation oncology. Phone discussion with Dr. Alen Blew.  Procedures:  Echocardiogram Study Conclusions - Left ventricle: The cavity size was normal. Wall thickness was increased in a pattern of mild LVH. Systolic function was vigorous. The estimated ejection fraction was in the range of 65% to 70%. Wall motion was normal; there were no regional wall motion abnormalities. Doppler parameters are consistent with abnormal left ventricular relaxation (grade 1 diastolic dysfunction). - Aortic valve: There was mild regurgitation.  HOSPITAL  COURSE:   Left brain mass, possible metastatic melanoma Patient did have midline shift. Vasogenic edema was present. Patient was seen by neurosurgery. She was placed on dexamethasone. Patient started feeling better. Radiation oncology was consulted. Neurosurgery has discussed the case at the tumor board and the plan is for [facility]. She had her radiation simulation this morning. Treatment will be given this week. No need for antiepileptic per Dr. Ronnald Ramp with neurosurgery. She'll be discharged on current dosage dexamethasone.  Metastatic disease/Recent weight loss and generalized weakness the setting of previous history of melanoma CT scan of the abdomen pelvis shows evidence for multiple pulmonary nodules, mediastinal adenopathy, paraspinal soft tissue mass and lesions in the pelvic bone. This is all suggestive of a metastatic process. Patient underwent biopsy of her skin lesion at the left neck, which was the previous site of her melanoma, a few days ago at her dermatologist office. Results are not available quite yet. Findings discussed with Dr. Alen Blew with oncology. He agrees that the current plan should be to address the brain lesion. He will be happy to see the patient in his office in the near future to discuss the other findings.  Questionable paroxysmal Atrial fibrillation with RVR. Apparently patient went into A. fib with RVR during the course of this hospitalization Unfortunately, no EKG documenting atrial fibrillation is available. There is some telemetry readings suggesting A. fib but its actually sinus rhythm with PACs. Patient was on Cardizem infusion for a brief time for tachycardia, which was thought to be atrial fibrillation. She then converted to sinus rhythm. So this could be paroxysmal atrial fibrillation, though we are unable to conclusively prove this. Echocardiogram is as above. EF is normal. No significant valvular disease is noted. TSH is normal. Chads2vasc score is at least 5.  Due to  presence of brain tumor, which is thought to be metastatic melanoma, the risk of bleeding is high with anticoagulation. Her atrial fibrillation was transient and we are not [id number] certain that it was atrial fibrillation to begin with. So at this time we will arrange for outpatient follow-up with cardiology. Discussed with Dr. Ronnald Ramp neurosurgery and it is okay to put her on aspirin.  Continue with propranolol.   History of coronary artery disease. She had intervention in 2004, to her LAD. She had a drug-eluting stent placed. She had a stress test in 2009, which did not show any evidence for ischemia. Mild aortic insufficiency was noted in 2013 along with mild mitral regurgitation. See echocardiogram report above done during this hospitalization.  Insomnia Continue with Ambien  Leukocytosis Detected as outpatient. Etiology is unclear. No obvious infectious source identified. Counts are higher most likely due to steroids. Outpatient follow-up with oncology.  Acute diarrhea. Stool studies negative for C. difficile. Monitor closely.  Essential hypertension. Monitor blood pressures closely.  Overall improved. There is a plan in place for radiation treatment to her brain lesion. Message sent to Dr. Alen Blew to arrange outpatient follow-up. Message also sent to cardiology office for same. Discussed with the patient, her husband and her son. Okay for discharge today.   PERTINENT LABS:  The results of significant diagnostics from this hospitalization (including imaging, microbiology, ancillary and laboratory) are listed below for reference.    Microbiology: Recent Results (from the past 240 hour(s))  Culture, blood (Routine X 2) w Reflex to ID Panel     Status: None (Preliminary result)   Collection Time: 09/04/15  3:42 PM  Result Value Ref Range Status   Specimen Description BLOOD LEFT ANTECUBITAL  Final   Special Requests BOTTLES DRAWN AEROBIC AND ANAEROBIC 5CC  Final   Culture NO GROWTH 4 DAYS   Final   Report Status PENDING  Incomplete  Culture, blood (Routine X 2) w Reflex to ID Panel     Status: None (Preliminary result)   Collection Time: 09/04/15  3:47 PM  Result Value Ref Range Status   Specimen Description BLOOD LEFT FOREARM  Final   Special Requests BOTTLES DRAWN AEROBIC AND ANAEROBIC 5CC  Final   Culture NO GROWTH 4 DAYS  Final   Report Status PENDING  Incomplete  MRSA PCR Screening     Status: None   Collection Time: 09/05/15 12:40 AM  Result Value Ref Range Status   MRSA by PCR NEGATIVE NEGATIVE Final    Comment:        The GeneXpert MRSA Assay (FDA approved for NASAL specimens only), is one component of a comprehensive MRSA colonization surveillance program. It is not intended to diagnose MRSA infection nor to guide or monitor treatment for MRSA infections.   C difficile quick scan w PCR reflex     Status: None   Collection Time: 09/06/15  6:40 AM  Result Value Ref Range Status   C Diff antigen NEGATIVE NEGATIVE Final   C Diff toxin NEGATIVE NEGATIVE Final   C Diff interpretation Negative for toxigenic C. difficile  Final     Labs: Basic Metabolic Panel:  Recent Labs Lab 09/04/15 1140 09/04/15 1145 09/05/15 0518 09/06/15 0952 09/07/15 0727  NA 131* 129* 129* 130* 130*  K 3.9 3.6 3.6 3.7 3.8  CL 91* 88* 96* 92* 91*  CO2 27  --  24 27 28   GLUCOSE 99 101* 131* 173* 135*  BUN 6 6 7 13  13  CREATININE 0.58 0.50 0.58 0.71 0.73  CALCIUM 9.3  --  8.9 9.4 9.3   Liver Function Tests:  Recent Labs Lab 09/04/15 1140  AST 19  ALT 12*  ALKPHOS 60  BILITOT 0.8  PROT 6.9  ALBUMIN 3.6   CBC:  Recent Labs Lab 09/04/15 1140 09/04/15 1145 09/05/15 0518 09/06/15 0952 09/07/15 0727  WBC 24.0*  --  20.8* 30.1* 29.7*  NEUTROABS 10.8*  --   --   --   --   HGB 15.1* 16.0* 14.3 14.6 14.9  HCT 44.5 47.0* 42.8 44.5 45.0  MCV 74.4*  --  74.0* 74.0* 74.0*  PLT 212  --  202 247 239    IMAGING STUDIES Dg Chest 2 View  09/04/2015  CLINICAL DATA:   Leukocytosis. Severe vasogenic edema in the left cerebral hemisphere. EXAM: CHEST  2 VIEW COMPARISON:  09/18/2007 FINDINGS: Scattered pulmonary nodules are present. A mass at the right lung base measures 3.1 cm in diameter. Appearance favoring pulmonary metastatic disease bilaterally. Atherosclerotic aortic arch. IMPRESSION: 1. Numerous scattered bilateral pulmonary nodules and a right basilar pulmonary mass. Appearance favors metastatic disease to both lungs, especially in light of the vasogenic edema in the brain. 2. Atherosclerotic aortic arch. Electronically Signed   By: Van Clines M.D.   On: 09/04/2015 15:22   Ct Head Wo Contrast  09/04/2015  CLINICAL DATA:  Frontal headache and weakness all over for several weeks. EXAM: CT HEAD WITHOUT CONTRAST TECHNIQUE: Contiguous axial images were obtained from the base of the skull through the vertex without intravenous contrast. COMPARISON:  04/02/2006 FINDINGS: Left vertebral artery atherosclerotic calcification. Prominent sulcal effacement in the left cerebral hemisphere with extensive white matter hypodensity favoring vasogenic edema primarily in the left temporal, occipital, and parietal lobes. Effacement of the left lateral ventricle 7 mm of left-to-right midline shift. I do not observe definite associated hematoma nor is there an obvious abnormality in the splenium of the corpus callosum or tracking cross to the contralateral side. There is some mild crowding along the ambient cisterns. The white matter hypodensity tracks into the posterior limb of the left internal capsule and posteriorly into the left external capsule. No definite abnormal extra-axial fluid collection is identified. No overt trapping of the right lateral ventricle at this time. IMPRESSION: 1. Severe vasogenic edema in the left temporal, parietal, and occipital lobes associated with sulcal effacement in the left cerebral hemisphere, 7 mm of left-to-right midline shift, and some effacement  of the ambient cistern and left lateral ventricle. This is a nonspecific but severe appearance which could reflect underlying tumor, encephalitis, or other severe inflammatory process. Pattern less likely to be due to stroke. When feasible, brain MRI with and without contrast is recommended for further assessment and characterization, and urgent neurologist consultation is recommended. Critical Value/emergent results were called by telephone at the time of interpretation on 09/04/2015 at 12:32 pm to Dr. Julianne Rice, who verbally acknowledged these results. Electronically Signed   By: Van Clines M.D.   On: 09/04/2015 12:34   Ct Chest W Contrast  09/05/2015  CLINICAL DATA:  Patient with history of metastatic melanoma. Recently diagnosed with brain metastasis. EXAM: CT CHEST, ABDOMEN, AND PELVIS WITH CONTRAST TECHNIQUE: Multidetector CT imaging of the chest, abdomen and pelvis was performed following the standard protocol during bolus administration of intravenous contrast. CONTRAST:  150mL OMNIPAQUE IOHEXOL 300 MG/ML  SOLN COMPARISON:  None. FINDINGS: CT CHEST FINDINGS Mediastinum/Lymph Nodes: Visualized thyroid is unremarkable. No axillary lymphadenopathy. Normal heart size. No  pericardial effusion. There is a 1.4 cm precarinal lymph node (image 22; series 2). Lungs/Pleura: Central airways are patent. Innumerable pulmonary nodules are demonstrated throughout the lungs bilaterally. Reference left lower lobe nodule measures 1.7 cm (image 46; series 3). Reference right middle lobe nodule measures 2.6 cm (image 42; series 3). No pleural effusion or pneumothorax. Musculoskeletal: No aggressive or acute appearing osseous lesions. CT ABDOMEN PELVIS FINDINGS Hepatobiliary: Liver is normal in size and contour. No definite focal lesion identified. Within the gallbladder lumen there is a 2.1 cm soft tissue mass. No gallbladder wall thickening or pericholecystic fluid. Pancreas: Unremarkable Spleen: Unremarkable  Adrenals/Urinary Tract: Adrenal glands are normal. Malrotation of the left kidney. Too small to characterize low-attenuation lesions within the left and right kidneys. No hydronephrosis. Urinary bladder is unremarkable. Stomach/Bowel: Sigmoid colonic diverticulosis. No CT evidence for acute diverticulitis. No free fluid or free intraperitoneal air. Vascular/Lymphatic: Tortuous yet normal caliber abdominal aorta. Peripheral calcified atherosclerotic plaque. No retroperitoneal lymphadenopathy. Other: Uterus is surgically absent. Small benign-appearing left adnexal cystic lesion. Musculoskeletal: Patchy lucencies within the superior aspect of the right and left ilium. Rightward curvature of the lumbar spine with associated degenerative disease. There is a 6.1 x 4.8 cm enhancing soft tissue mass within the right paraspinous musculature (image 76; series 2). IMPRESSION: Findings compatible with metastatic disease including innumerable bilateral pulmonary nodules, mediastinal adenopathy and right paraspinous soft tissue mass. Nonspecific lucencies within the right and left ilium may represent metastatic disease. Soft tissue mass within the gallbladder lumen may represent stone or potentially metastatic disease. Electronically Signed   By: Lovey Newcomer M.D.   On: 09/05/2015 13:03   Mr Jeri Cos [id number] Contrast  09/04/2015  CLINICAL DATA:  Slowly progressive visual loss, difficulty with cognition, and RIGHT-sided weakness. History of melanoma resection LEFT neck 2 years ago. EXAM: MRI HEAD WITHOUT AND WITH CONTRAST TECHNIQUE: Multiplanar, multiecho pulse sequences of the brain and surrounding structures were obtained without and with intravenous contrast. CONTRAST:  58mL MULTIHANCE GADOBENATE DIMEGLUMINE 529 MG/ML IV SOLN COMPARISON:  CT head earlier today. FINDINGS: No acute stroke, hydrocephalus, or extra-axial fluid. As predicted from CT, there is an intracranial mass contributing to vasogenic edema. The mass is solitary,  located in the LEFT occipital lobe, superficial, but intra-axial. There is T1 hyperintensity precontrast with avid enhancement postcontrast. The lesion measures 33 x 31 x 29 mm, and does share an inferior border with the LEFT transverse sinus. Overall the mass is hypointense on T2 weighted imaging. Gradient sequence does not demonstrate acute hemorrhage, but a few prominent vessels are noted. Early uncal herniation is noted, with LEFT-to-RIGHT shift of 7 mm at the anterior third ventricle level. Normal for age cerebral volume. Minor white matter disease. No hydrocephalus or ventricular trapping. No tonsillar herniation. Extracranial soft tissues unremarkable. IMPRESSION: Solitary, greater than 3 cm, LEFT occipital intra-axial metastasis, demonstrating precontrast T1 shortening consistent with metastatic melanoma. Avid postcontrast enhancement and prominent intratumoral vessels suggest a highly vascular lesion. Extensive vasogenic edema with early LEFT uncal herniation and 7 mm LEFT-to-RIGHT shift. Electronically Signed   By: Staci Righter M.D.   On: 09/04/2015 19:10   Ct Abdomen Pelvis W Contrast  09/05/2015  CLINICAL DATA:  Patient with history of metastatic melanoma. Recently diagnosed with brain metastasis. EXAM: CT CHEST, ABDOMEN, AND PELVIS WITH CONTRAST TECHNIQUE: Multidetector CT imaging of the chest, abdomen and pelvis was performed following the standard protocol during bolus administration of intravenous contrast. CONTRAST:  148mL OMNIPAQUE IOHEXOL 300 MG/ML  SOLN COMPARISON:  None.  FINDINGS: CT CHEST FINDINGS Mediastinum/Lymph Nodes: Visualized thyroid is unremarkable. No axillary lymphadenopathy. Normal heart size. No pericardial effusion. There is a 1.4 cm precarinal lymph node (image 22; series 2). Lungs/Pleura: Central airways are patent. Innumerable pulmonary nodules are demonstrated throughout the lungs bilaterally. Reference left lower lobe nodule measures 1.7 cm (image 46; series 3). Reference  right middle lobe nodule measures 2.6 cm (image 42; series 3). No pleural effusion or pneumothorax. Musculoskeletal: No aggressive or acute appearing osseous lesions. CT ABDOMEN PELVIS FINDINGS Hepatobiliary: Liver is normal in size and contour. No definite focal lesion identified. Within the gallbladder lumen there is a 2.1 cm soft tissue mass. No gallbladder wall thickening or pericholecystic fluid. Pancreas: Unremarkable Spleen: Unremarkable Adrenals/Urinary Tract: Adrenal glands are normal. Malrotation of the left kidney. Too small to characterize low-attenuation lesions within the left and right kidneys. No hydronephrosis. Urinary bladder is unremarkable. Stomach/Bowel: Sigmoid colonic diverticulosis. No CT evidence for acute diverticulitis. No free fluid or free intraperitoneal air. Vascular/Lymphatic: Tortuous yet normal caliber abdominal aorta. Peripheral calcified atherosclerotic plaque. No retroperitoneal lymphadenopathy. Other: Uterus is surgically absent. Small benign-appearing left adnexal cystic lesion. Musculoskeletal: Patchy lucencies within the superior aspect of the right and left ilium. Rightward curvature of the lumbar spine with associated degenerative disease. There is a 6.1 x 4.8 cm enhancing soft tissue mass within the right paraspinous musculature (image 76; series 2). IMPRESSION: Findings compatible with metastatic disease including innumerable bilateral pulmonary nodules, mediastinal adenopathy and right paraspinous soft tissue mass. Nonspecific lucencies within the right and left ilium may represent metastatic disease. Soft tissue mass within the gallbladder lumen may represent stone or potentially metastatic disease. Electronically Signed   By: Lovey Newcomer M.D.   On: 09/05/2015 13:03    DISCHARGE EXAMINATION: Filed Vitals:   09/07/15 1353 09/07/15 2104 09/08/15 0547 09/08/15 1225  BP: 171/65 150/68 152/64 188/69  Pulse: 52 48 50 48  Temp: 98.7 F (37.1 C) 98.7 F (37.1 C) 97.4  F (36.3 C) 97.9 F (36.6 C)  TempSrc:  Oral  Oral  Resp: 18 16 18 18   Height:      Weight:      SpO2: 99% 95% 99% 97%   General appearance: alert, cooperative, appears stated age and no distress Resp: clear to auscultation bilaterally Cardio: regular rate and rhythm, S1, S2 normal, no murmur, click, rub or gallop GI: soft, non-tender; bowel sounds normal; no masses,  no organomegaly Extremities: extremities normal, atraumatic, no cyanosis or edema  DISPOSITION: Home with family  Discharge Instructions    Call MD for:  difficulty breathing, headache or visual disturbances    Complete by:  As directed      Call MD for:  extreme fatigue    Complete by:  As directed      Call MD for:  persistant dizziness or light-headedness    Complete by:  As directed      Call MD for:  persistant nausea and vomiting    Complete by:  As directed      Call MD for:  severe uncontrolled pain    Complete by:  As directed      Call MD for:  temperature >100.4    Complete by:  As directed      Diet - low sodium heart healthy    Complete by:  As directed      Discharge instructions    Complete by:  As directed   You will have follow-up with the cardiologist as well as the  oncologist for further care. Please return for radiation treatment as instructed by the radiation oncologist. Dexamethasone can increase blood glucose levels. Please have this checked periodically.  You were cared for by a hospitalist during your hospital stay. If you have any questions about your discharge medications or the care you received while you were in the hospital after you are discharged, you can call the unit and asked to speak with the hospitalist on call if the hospitalist that took care of you is not available. Once you are discharged, your primary care physician will handle any further medical issues. Please note that NO REFILLS for any discharge medications will be authorized once you are discharged, as it is imperative  that you return to your primary care physician (or establish a relationship with a primary care physician if you do not have one) for your aftercare needs so that they can reassess your need for medications and monitor your lab values. If you do not have a primary care physician, you can call [phone] for a physician referral.     Increase activity slowly    Complete by:  As directed            ALLERGIES:  Allergies  Allergen Reactions  . Buprenorphine Hcl Other (See Comments)    Make her see things that are not there and makes her nausea   . Morphine And Related     Make her see things that are not there and makes her nausea   . Prednisone Other (See Comments)    Made her hyper and could not sleep      Current Discharge Medication List    START taking these medications   Details  dexamethasone (DECADRON) 4 MG tablet Take 1 tablet (4 mg total) by mouth every 6 (six) hours. Qty: 120 tablet, Refills: 1      CONTINUE these medications which have CHANGED   Details  aspirin EC 325 MG tablet Take 1 tablet (325 mg total) by mouth daily. Qty: 30 tablet, Refills: 3      CONTINUE these medications which have NOT CHANGED   Details  amLODipine (NORVASC) 5 MG tablet Take 1 tablet (5 mg total) by mouth daily. Qty: 90 tablet, Refills: 3   Associated Diagnoses: Benign hypertensive heart disease without heart failure    HYDROcodone-acetaminophen (NORCO/VICODIN) 5-325 MG per tablet Take 1 tablet by mouth every 6 (six) hours as needed for moderate pain.    Probiotic Product (PROBIOTIC DAILY PO) Take by mouth daily.    propranolol (INDERAL) 40 MG tablet Take 1 tablet (40 mg total) by mouth 2 (two) times daily. Qty: 180 tablet, Refills: 3    simvastatin (ZOCOR) 20 MG tablet Take 1 tablet (20 mg total) by mouth at bedtime. Qty: 90 tablet, Refills: 3   Associated Diagnoses: Pure hypercholesterolemia    zolpidem (AMBIEN) 10 MG tablet Take 1 tablet (10 mg total) by mouth at bedtime as  needed for sleep. Qty: 90 tablet, Refills: 0   Associated Diagnoses: Insomnia    nitroGLYCERIN (NITROSTAT) 0.4 MG SL tablet Place 1 tablet (0.4 mg total) under the tongue every 5 (five) minutes as needed for chest pain. Qty: 25 tablet, Refills: prn   Associated Diagnoses: Ischemic heart disease       Follow-up Information    Follow up with [facility].   Specialty:  Rehabilitation   Why:  referral made for outpatient pt, st and ot   Contact information:   [address]  [address] [place]      Call Stephens Shire, MD.   Specialty:  Family Medicine   Why:  As needed   Contact information:   [place] [address] [phone]       Follow up with Peter Martinique, MD.   Specialty:  Cardiology   Why:  His office will call with appointment   Contact information:   [address] [place] [address] [phone]       Follow up with [facility], MD.   Specialty:  Oncology   Why:  his office will call you for appointment for further treatment of the cancer   Contact information:   [address] [place] [address] [phone]       TOTAL DISCHARGE TIME: 35 minutes  [facility] Pager [phone]  09/08/2015, 3:10 PM

## 2015-09-08 NOTE — Discharge Instructions (Signed)
Metastatic Brain Tumor °A metastatic brain tumor is a growth in your brain. It is made of cancer cells from another part of your body that traveled to your brain through your bloodstream, along the nerves of your brain (cranial nerves), or through the opening at the base of your skull.  °CAUSES  °The most common cause of a metastatic brain tumor in men is lung cancer. In women, it is breast cancer. Other common causes include: °· Unknown types of cancers. °· Skin cancer (melanoma). °· Colon cancer. °· Kidney cancer. °SIGNS AND SYMPTOMS  °Most signs and symptoms of metastatic brain tumors are caused by increased pressure inside your brain. A headache is often the first symptom. Eventually, almost everyone with a metastatic brain tumor has a headache. Other signs and symptoms include: °· Vomiting. °· Weakness or fatigue. °· Seizures. °· Sensory problems, like tingling or numbness. °· Problems walking. °· Clumsiness. °· Emotional changes. °· Personality changes. °· Changes in speech or vision. °DIAGNOSIS  °Your health care provider can diagnose a metastatic brain tumor from your medical history and physical exam. You may also have some additional tests, including: °· A nervous system function test (neurologic exam). °· Imaging studies of your brain, such as an MRI and CT scan. °· Having a small piece of tissue removed (biopsy) from your brain to be checked under a microscope. °TREATMENT  °Treatment depends on the type of cancer you have and your overall health. It also depends on the size of your tumor and the number of brain tumors you have. The most common treatments include: °· Medicines to control pain, nausea, and seizures. °· Cancer-killing drugs (chemotherapy). °· An X-ray treatment called radiation therapy to kill brain tumor cells. °· A type of radiation therapy that is targeted to exact locations in the brain (stereotactic radiotherapy). °· Surgery to remove brain tumors. °HOME CARE INSTRUCTIONS °· Only take  medicines as directed by your health care provider. °· Do not drive if: °¨ You are taking strong pain medicines. °¨ Your vision or thinking is not clear. °· Take two 10-minute walks every day if you are able. Exercising is a good way to relieve stress. It can also help you sleep. °· Be sure to get enough calories and protein in your diet. °¨ If you have a poor appetite or feel nauseous, eat small meals often. °¨ Supplement your diet with protein shakes or milk shakes. °· It is common to have anxiety and depression when you are coping with cancer. °¨ Talk to friends and loved ones about your feelings. °¨ Have a good support system at home. °SEEK MEDICAL CARE IF: °· You have chills or fever. °· Your medicine is not controlling your symptoms. °· Your symptoms get worse or you develop new symptoms. °· You are having trouble caring for yourself or being cared for at home. °· You are struggling with anxiety or depression. °SEEK IMMEDIATE MEDICAL CARE IF: °· You cannot stop vomiting. °· You cannot keep down any foods or fluids. °· You have sudden changes in speech or vision. °· You have a seizure. °· You can no longer take care of yourself at home. °  °This information is not intended to replace advice given to you by your health care provider. Make sure you discuss any questions you have with your health care provider. °  °Document Released: 03/10/2004 Document Revised: 07/05/2014 Document Reviewed: 06/05/2013 °Elsevier Interactive Patient Education ©2016 [employer] ° °

## 2015-09-08 NOTE — Progress Notes (Signed)
Patient ID: Susan Clark, female   DOB: 1929/03/14, 82 y.o.   MRN: [id number] Patient was discussed and brain tumor conference this morning and the feeling is that , given her age and metastatic disease, she is best treated with SRS only. We will forego any surgical intervention. This is the wishes of the family. She looks good this morning. She is awake and alert and pleasant and interactive. No focal deficit.

## 2015-09-08 NOTE — Care Management Important Message (Signed)
Important Message  Patient Details  Name: Susan Clark MRN: [id number] Date of Birth: [birth year]   Medicare Important Message Given:  Yes    Dalphine Cowie [name] 09/08/2015, 9:56 AM

## 2015-09-09 LAB — CULTURE, BLOOD (ROUTINE X 2)
CULTURE: NO GROWTH
CULTURE: NO GROWTH

## 2015-09-10 DIAGNOSIS — Z510 Encounter for antineoplastic radiation therapy: Secondary | ICD-10-CM | POA: Diagnosis not present

## 2015-09-10 DIAGNOSIS — C7931 Secondary malignant neoplasm of brain: Secondary | ICD-10-CM | POA: Diagnosis present

## 2015-09-10 DIAGNOSIS — C801 Malignant (primary) neoplasm, unspecified: Secondary | ICD-10-CM | POA: Diagnosis not present

## 2015-09-11 ENCOUNTER — Encounter: Payer: Self-pay | Admitting: Radiation Oncology

## 2015-09-11 ENCOUNTER — Ambulatory Visit
Admit: 2015-09-11 | Discharge: 2015-09-11 | Disposition: A | Discharge status: Home / Self Care | Payer: PPO | Attending: Radiation Oncology | Admitting: Radiation Oncology

## 2015-09-11 ENCOUNTER — Ambulatory Visit: Payer: PPO | Admitting: Oncology

## 2015-09-11 VITALS — BP 174/79 | HR 68 | Temp 97.7°F | Resp 16

## 2015-09-11 DIAGNOSIS — Z510 Encounter for antineoplastic radiation therapy: Secondary | ICD-10-CM | POA: Diagnosis not present

## 2015-09-11 DIAGNOSIS — C7931 Secondary malignant neoplasm of brain: Secondary | ICD-10-CM

## 2015-09-11 NOTE — Progress Notes (Signed)
Patient brought  To room 1 after SRS tx brain 1/3erd completed, no c/o pain, nausea, no dizzy ness or vision changes, just weak, stated "that went better than I thought" will go home and rest after 15 minutes of monitoring, vitals done, 97% room air sats, no c/o sob, spouse and son at side, patient doesn't drive, patient to call for any unusual symptoms not normal for her 2:11 PM

## 2015-09-11 NOTE — Progress Notes (Signed)
  Name: Susan Clark  MRN: [id number]  Date: 09/11/2015   DOB: 1929/01/18  Stereotactic Radiosurgery Operative Note  PRE-OPERATIVE DIAGNOSIS:  Solitary Brain Metastasis  POST-OPERATIVE DIAGNOSIS:  Solitary Brain Metastasis  PROCEDURE:  Stereotactic Radiosurgery  SURGEON:  Eustace Moore, MD  NARRATIVE: The patient underwent a radiation treatment planning session in the radiation oncology simulation suite under the care of the radiation oncology physician and physicist.  I participated closely in the radiation treatment planning afterwards. The patient underwent planning CT which was fused to 3T high resolution MRI with 1 mm axial slices.  These images were fused on the planning system.  We contoured the gross target volumes and subsequently expanded this to yield the Planning Target Volume. I actively participated in the planning process.  I helped to define and review the target contours and also the contours of the optic pathway, eyes, brainstem and selected nearby organs at risk.  All the dose constraints for critical structures were reviewed and compared to AAPM Task Group 101.  The prescription dose conformity was reviewed.  I approved the plan electronically.    Accordingly, Shonta YITZEL CATOE was brought to the TrueBeam stereotactic radiation treatment linac and placed in the custom immobilization mask.  The patient was aligned according to the IR fiducial markers with BrainLab Exactrac, then orthogonal x-rays were used in ExacTrac with the 6DOF robotic table and the shifts were made to align the patient  Kally Dorena Bodo received stereotactic radiosurgery uneventfully.    The detailed description of the procedure is recorded in the radiation oncology procedure note.  I was present for the duration of the procedure.  DISPOSITION:  Following delivery, the patient was transported to nursing in stable condition and monitored for possible acute effects to be discharged to home in stable  condition with follow-up in one month.  Eustace Moore, MD 09/11/2015 1:52 PM

## 2015-09-11 NOTE — Progress Notes (Addendum)
  Radiation Oncology         [phone] ________________________________  Stereotactic Treatment Procedure Note  Name: Susan Clark MRN: [id number]  Date: 09/11/2015  DOB: [birth year]  SPECIAL TREATMENT PROCEDURE    ICD-9-CM ICD-10-CM   1. 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  It showed 3-dimensional radiation distributions overlaid onto the planning CT/MRI image set.  The [facility] for the target structures as well as the organs at risk were reviewed. The documentation of the 3D plan and dosimetry are filed in the radiation oncology EMR.  NARRATIVE:  Susan Clark was brought to the TrueBeam stereotactic radiation treatment machine and placed supine on the CT couch. The head frame was applied, and the patient was set up for stereotactic radiosurgery.  Neurosurgery was present for the set-up and delivery  SIMULATION VERIFICATION:  In the couch zero-angle position, the patient underwent Exactrac imaging using the Brainlab system with orthogonal KV images.  These were carefully aligned and repeated to confirm treatment position for each of the isocenters.  The Exactrac snap film verification was repeated at each couch angle.  PROCEDURE: Susan Clark received stereotactic radiosurgery to the following targets: Left occipital 3.3 cm target was treated using 4 VMAT Arcs to a fraction dose of 8 Gy to be repeated three times for a total prescription dose of 24 Gy. Marland Kitchen  ExacTrac registration was performed for each couch angle.  The 100% isodose line was prescribed.  6 MV X-rays were delivered in the flattening filter free beam mode.  STEREOTACTIC TREATMENT MANAGEMENT:  Following delivery, the patient was transported to nursing in stable condition and monitored for possible acute effects.  Vital signs were recorded BP  174/79 mmHg  Pulse 68  Temp(Src) 97.7 F (36.5 C) (Oral)  Resp 16  SpO2 97%. The patient tolerated treatment without significant acute effects, and was discharged to home in stable condition.    PLAN: Finish 3 treatments and then follow-up in one month.  ________________________________  Sheral Apley. Tammi Klippel, M.D.

## 2015-09-12 ENCOUNTER — Encounter: Payer: Self-pay | Admitting: *Deleted

## 2015-09-12 ENCOUNTER — Telehealth: Payer: Self-pay | Admitting: Oncology

## 2015-09-12 ENCOUNTER — Telehealth: Payer: Self-pay | Admitting: *Deleted

## 2015-09-12 NOTE — Telephone Encounter (Signed)
new patient appt-s/w patient husband and gave new patient appt for 03/24 @ 11 w/Dr. Alen Blew.

## 2015-09-12 NOTE — Telephone Encounter (Signed)
Spoke with erin at dr Davyon Fisch's office [phone]. They have requested slides from skin biopsy, they will fax [place] a report of the biopsy, they will arrange BRAF testing as well.

## 2015-09-15 ENCOUNTER — Ambulatory Visit
Admit: 2015-09-15 | Discharge: 2015-09-15 | Disposition: A | Discharge status: Home / Self Care | Payer: PPO | Attending: Radiation Oncology | Admitting: Radiation Oncology

## 2015-09-15 VITALS — BP 186/64 | HR 46 | Temp 98.3°F | Resp 16

## 2015-09-15 DIAGNOSIS — Z510 Encounter for antineoplastic radiation therapy: Secondary | ICD-10-CM | POA: Diagnosis not present

## 2015-09-15 DIAGNOSIS — C7931 Secondary malignant neoplasm of brain: Secondary | ICD-10-CM

## 2015-09-15 NOTE — Progress Notes (Signed)
BP elevated. Heart rate low. Denies headache, dizziness, nausea, diplopia or ringing in the ears. States, "my heart rate runs low because of the medication I take." reports taking decadron 4 mg every six hours as directed. No thrush noted. Understands to avoid strenuous activity for the next 24 hours and call [phone] with needs.

## 2015-09-15 NOTE — Progress Notes (Addendum)
  Radiation Oncology         [phone] ________________________________  Stereotactic Treatment Procedure Note  Name: Susan Clark MRN: [id number]  Date: 09/15/2015  DOB: [birth year]  SPECIAL TREATMENT PROCEDURE    ICD-9-CM ICD-10-CM   1. 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  It showed 3-dimensional radiation distributions overlaid onto the planning CT/MRI image set.  The [facility] for the target structures as well as the organs at risk were reviewed. The documentation of the 3D plan and dosimetry are filed in the radiation oncology EMR.  NARRATIVE:  Susan Clark was brought to the TrueBeam stereotactic radiation treatment machine and placed supine on the CT couch. The head frame was applied, and the patient was set up for stereotactic radiosurgery.     SIMULATION VERIFICATION:  In the couch zero-angle position, the patient underwent Exactrac imaging using the Brainlab system with orthogonal KV images.  These were carefully aligned and repeated to confirm treatment position for each of the isocenters.  The Exactrac snap film verification was repeated at each couch angle.  PROCEDURE: Susan Clark received stereotactic radiosurgery to the following targets: Left occipital 3.3 cm target was treated using 4 VMAT beams to a fraction dose of 8 Gy to be repeated three times for a total prescription dose of 24 Gy. Marland Kitchen  ExacTrac registration was performed for each couch angle.  The 100% isodose line was prescribed.  6 MV X-rays were delivered in the flattening filter free beam mode.  STEREOTACTIC TREATMENT MANAGEMENT:  Following delivery, the patient was transported to nursing in stable condition and monitored for possible acute effects.  Vital signs were recorded BP 186/64 mmHg  Pulse 46  Temp(Src) 98.3 F (36.8 C) (Oral)   Resp 16  SpO2 100%. The patient tolerated treatment without significant acute effects, and was discharged to home in stable condition.    PLAN: Finish 3 treatments and then follow-up in one month. -----------------------------------  Eppie Gibson, MD

## 2015-09-17 ENCOUNTER — Encounter: Payer: Self-pay | Admitting: Radiation Oncology

## 2015-09-17 ENCOUNTER — Ambulatory Visit
Admit: 2015-09-17 | Discharge: 2015-09-17 | Disposition: A | Discharge status: Home / Self Care | Payer: PPO | Attending: Radiation Oncology | Admitting: Radiation Oncology

## 2015-09-17 VITALS — BP 138/80 | HR 56 | Temp 97.9°F | Resp 20

## 2015-09-17 DIAGNOSIS — Z510 Encounter for antineoplastic radiation therapy: Secondary | ICD-10-CM | POA: Diagnosis not present

## 2015-09-17 DIAGNOSIS — C7931 Secondary malignant neoplasm of brain: Secondary | ICD-10-CM

## 2015-09-17 NOTE — Progress Notes (Signed)
Patient brought  to nursing station room 2,  In w/c, no c/o pain, or nausea, , fatigued some,  Family at side, monitor for 15 minutes, f/u 10/13/15, with Dr. Tammi Klippel 12:53 PM BP 138/80 mmHg  Pulse 56  Temp(Src) 97.9 F (36.6 C) (Oral)  Resp 20  SpO2 98%

## 2015-09-17 NOTE — Progress Notes (Signed)
  Radiation Oncology         [phone] ________________________________  Stereotactic Treatment Procedure Note  Name: JOJO LUEPKE MRN: [id number]  Date: 09/17/2015  DOB: Feb 08, 1929  SPECIAL TREATMENT PROCEDURE    ICD-9-CM ICD-10-CM   1. 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  It showed 3-dimensional radiation distributions overlaid onto the planning CT/MRI image set.  The [facility] for the target structures as well as the organs at risk were reviewed. The documentation of the 3D plan and dosimetry are filed in the radiation oncology EMR.  NARRATIVE:  KENSLEE CHROMY was brought to the TrueBeam stereotactic radiation treatment machine and placed supine on the CT couch. The head frame was applied, and the patient was set up for stereotactic radiosurgery.     SIMULATION VERIFICATION:  In the couch zero-angle position, the patient underwent Exactrac imaging using the Brainlab system with orthogonal KV images.  These were carefully aligned and repeated to confirm treatment position for each of the isocenters.  The Exactrac snap film verification was repeated at each couch angle.  PROCEDURE: Thayer Ohm received stereotactic radiosurgery to the following targets: Left occipital 3.3 cm target was treated using 4 VMAT beams to a fraction dose of 8 Gy to be repeated three times for a total prescription dose of 24 Gy. Marland Kitchen  ExacTrac registration was performed for each couch angle.  The 100% isodose line was prescribed.  6 MV X-rays were delivered in the flattening filter free beam mode.  STEREOTACTIC TREATMENT MANAGEMENT:  Following delivery, the patient was transported to nursing in stable condition and monitored for possible acute effects.  Vital signs were recorded BP 138/80 mmHg  Pulse 56  Temp(Src) 97.9 F (36.6 C) (Oral)   Resp 20  SpO2 98%. The patient tolerated treatment without significant acute effects, and was discharged to home in stable condition.    PLAN: After  3 treatments she is to follow-up in one month. -----------------------------------  Eppie Gibson, MD

## 2015-09-19 ENCOUNTER — Ambulatory Visit (HOSPITAL_BASED_OUTPATIENT_CLINIC_OR_DEPARTMENT_OTHER): Payer: PPO | Admitting: Oncology

## 2015-09-19 ENCOUNTER — Telehealth: Payer: Self-pay | Admitting: Oncology

## 2015-09-19 VITALS — BP 157/79 | HR 52 | Temp 97.5°F | Resp 17 | Ht 65.0 in | Wt 139.3 lb

## 2015-09-19 DIAGNOSIS — C7931 Secondary malignant neoplasm of brain: Secondary | ICD-10-CM

## 2015-09-19 DIAGNOSIS — Z85820 Personal history of malignant melanoma of skin: Secondary | ICD-10-CM

## 2015-09-19 DIAGNOSIS — C801 Malignant (primary) neoplasm, unspecified: Secondary | ICD-10-CM

## 2015-09-19 NOTE — Progress Notes (Signed)
Please see consult note.  

## 2015-09-19 NOTE — Consult Note (Signed)
Reason for Referral: Metastatic malignancy.   HPI: 82 year old woman currently of [place] where she has been living majority of her life. She has a history of coronary artery disease but for the most part and reasonably good health. She was diagnosed with melanoma in 2011. At that time she presented with a lesion on the left side of her neck that became more prominent over the years. It had grown as well as change shape over. Time which prompted evaluation by dermatology. She underwent a shave biopsy which showed malignant melanoma, superficial spreading/lentigo maligna subtype with desmoplastic features. The tumor thickness was 0.6 mm and Clark's level was IV. The pathological staging was T1b. No lymphovascular invasion or ulceration noted. She subsequently underwent a wider excision although the results of the pathology was not available to me. She had recently developed another skin lesion that was removed from the neck area but found to be a basal cell carcinoma. She presented on 09/04/2015 with symptoms of weakness and visual field deficits in the left eye as well as mental status changes. CT scan of the brain showed severe vasogenic edema in the left temporal, parietal and occipital lobes with a 7 mm left-to-right midline shift, and a MRI was then performed revealing a 3.3 x 3.1 x 2.9 cm mass along the left occipital lobe, superficial but intra-axial. No evidence of acute hemorrhage was present but a few prominent vessels were noted. Early uncal herniation is noted and left-to-right shift was also seen again in measuring 7 mm at the anterior third ventricle level. CT scan chest abdomen and pelvis also showed pulmonary nodules, paraspinal soft tissue mass, possible iliac bone involvement and possible gallbladder soft-tissue mass involvement. She was evaluated by radiation oncology and neurosurgery and finished SRS treatments on 09/17/2015. She was also started on dexamethasone 4 mg every 6 hours. She  reports her neurological symptoms and improved including decrease headaches and visual changes. Her appetite still rather poor and her mobility although limited remained reasonably maintained. She has not reported any recent headaches or lower extremity weakness. She has not reported any new neurological deficits. She does not report any back pain or shoulder pain or loss of bowel or bladder function.  She does not report any fevers, chills, sweats or weight loss. She does not report any chest pain, palpitation, orthopnea or leg edema. She does not report any cough, wheezing, hemoptysis or hematemesis. Does not report any nausea, vomiting, abdominal pain, hematochezia or melena. She does not report any frequency, urgency or hesitancy. She does not report any skeleton complaints of arthralgias or myalgias. She had chronic back pain which is unchanged. She does report knee pain which is also unchanged. She does not report any lymphadenopathy or petechiae. She does report ecchymosis on her upper extremities.   Past Medical History  Diagnosis Date  . Palpitations     occasional  . Chronic back pain   . Hypertension   :  Past Surgical History  Procedure Laterality Date  . Tonsillectomy and adenoidectomy    . Partial hysterectomy  1971  . Anterior cervical discectomy  03/28/2006    C4-C5  . Cardiovascular stress test  09/13/2007    EF 74%, normal study  . Coronary angioplasty with stent placement  2004    UNKNOWN WHICH TYPE  :   Current outpatient prescriptions:  .  amLODipine (NORVASC) 5 MG tablet, Take 1 tablet (5 mg total) by mouth daily., Disp: 90 tablet, Rfl: 3 .  aspirin EC 325 MG tablet,  Take 1 tablet (325 mg total) by mouth daily., Disp: 30 tablet, Rfl: 3 .  dexamethasone (DECADRON) 4 MG tablet, Take 1 tablet (4 mg total) by mouth every 6 (six) hours., Disp: 120 tablet, Rfl: 1 .  nitroGLYCERIN (NITROSTAT) 0.4 MG SL tablet, Place 1 tablet (0.4 mg total) under the tongue every 5 (five)  minutes as needed for chest pain., Disp: 25 tablet, Rfl: prn .  Probiotic Product (PROBIOTIC DAILY PO), Take by mouth daily., Disp: , Rfl:  .  propranolol (INDERAL) 40 MG tablet, Take 1 tablet (40 mg total) by mouth 2 (two) times daily., Disp: 180 tablet, Rfl: 3 .  simvastatin (ZOCOR) 20 MG tablet, Take 1 tablet (20 mg total) by mouth at bedtime., Disp: 90 tablet, Rfl: 3 .  zolpidem (AMBIEN) 10 MG tablet, Take 1 tablet (10 mg total) by mouth at bedtime as needed for sleep., Disp: 90 tablet, Rfl: 0:  Allergies  Allergen Reactions  . Buprenorphine Hcl Other (See Comments)    Make her see things that are not there and makes her nausea   . Morphine And Related     Make her see things that are not there and makes her nausea   . Prednisone Other (See Comments)    Made her hyper and could not sleep   :  Family History  Problem Relation Age of Onset  . Heart disease Father   . Arthritis Father   . Heart attack Father   . Cancer Sister     colon  . Colon cancer Sister   . Hypertension Mother   . Cancer Mother     lung cancer  . Emphysema Brother   . Bone cancer Brother   . Bladder Cancer Brother   :  Social History   Social History  . Marital Status: Married    Spouse Name: N/A  . Number of Children: 4  . Years of Education: N/A   Occupational History  .     Social History Main Topics  . Smoking status: Never Smoker   . Smokeless tobacco: Not on file  . Alcohol Use: No  . Drug Use: Not on file  . Sexual Activity: Not on file   Other Topics Concern  . Not on file   Social History Narrative  :  Pertinent items are noted in HPI.  Exam: ECOG 2 Blood pressure 157/79, pulse 52, temperature 97.5 F (36.4 C), temperature source Oral, resp. rate 17, height 5' 5"  (1.651 m), weight 139 lb 4.8 oz (63.186 kg), SpO2 100 %. General appearance: alert and cooperative appeared without distress. Head: Normocephalic, without obvious abnormality no oral ulcers or lesions. Neck: no  adenopathy Back: negative Resp: clear to auscultation bilaterally Chest wall: no tenderness Cardio: regular rate and rhythm, S1, S2 normal, no murmur, click, rub or gallop GI: soft, non-tender; bowel sounds normal; no masses,  no organomegaly Extremities: extremities normal, atraumatic, no cyanosis or edema Pulses: 2+ and symmetric Skin: Skin color, texture, turgor normal. Ecchymosis noted on her upper extremities. Lymph nodes: Cervical, supraclavicular, and axillary nodes normal.  logical: No deficits noted.   CBC    Component Value Date/Time   WBC 29.7* 09/07/2015 0727   RBC 6.08* 09/07/2015 0727   HGB 14.9 09/07/2015 0727   HCT 45.0 09/07/2015 0727   PLT 239 09/07/2015 0727   MCV 74.0* 09/07/2015 0727   MCH 24.5* 09/07/2015 0727   MCHC 33.1 09/07/2015 0727   RDW 15.6* 09/07/2015 0727   LYMPHSABS 9.6* 09/04/2015  1140   MONOABS 3.6* 09/04/2015 1140   EOSABS 0.0 09/04/2015 1140   BASOSABS 0.0 09/04/2015 1140      Chemistry      Component Value Date/Time   NA 130* 09/07/2015 0727   K 3.8 09/07/2015 0727   CL 91* 09/07/2015 0727   CO2 28 09/07/2015 0727   BUN 13 09/07/2015 0727   CREATININE 0.73 09/07/2015 0727   CREATININE 0.77 07/02/2015 0817      Component Value Date/Time   CALCIUM 9.3 09/07/2015 0727   ALKPHOS 60 09/04/2015 1140   AST 19 09/04/2015 1140   ALT 12* 09/04/2015 1140   BILITOT 0.8 09/04/2015 1140       Dg Chest 2 View  09/04/2015  CLINICAL DATA:  Leukocytosis. Severe vasogenic edema in the left cerebral hemisphere. EXAM: CHEST  2 VIEW COMPARISON:  09/18/2007 FINDINGS: Scattered pulmonary nodules are present. A mass at the right lung base measures 3.1 cm in diameter. Appearance favoring pulmonary metastatic disease bilaterally. Atherosclerotic aortic arch. IMPRESSION: 1. Numerous scattered bilateral pulmonary nodules and a right basilar pulmonary mass. Appearance favors metastatic disease to both lungs, especially in light of the vasogenic edema in the  brain. 2. Atherosclerotic aortic arch. Electronically Signed   By: Van Clines M.D.   On: 09/04/2015 15:22   Ct Head Wo Contrast  09/04/2015  CLINICAL DATA:  Frontal headache and weakness all over for several weeks. EXAM: CT HEAD WITHOUT CONTRAST TECHNIQUE: Contiguous axial images were obtained from the base of the skull through the vertex without intravenous contrast. COMPARISON:  04/02/2006 FINDINGS: Left vertebral artery atherosclerotic calcification. Prominent sulcal effacement in the left cerebral hemisphere with extensive white matter hypodensity favoring vasogenic edema primarily in the left temporal, occipital, and parietal lobes. Effacement of the left lateral ventricle 7 mm of left-to-right midline shift. I do not observe definite associated hematoma nor is there an obvious abnormality in the splenium of the corpus callosum or tracking cross to the contralateral side. There is some mild crowding along the ambient cisterns. The white matter hypodensity tracks into the posterior limb of the left internal capsule and posteriorly into the left external capsule. No definite abnormal extra-axial fluid collection is identified. No overt trapping of the right lateral ventricle at this time. IMPRESSION: 1. Severe vasogenic edema in the left temporal, parietal, and occipital lobes associated with sulcal effacement in the left cerebral hemisphere, 7 mm of left-to-right midline shift, and some effacement of the ambient cistern and left lateral ventricle. This is a nonspecific but severe appearance which could reflect underlying tumor, encephalitis, or other severe inflammatory process. Pattern less likely to be due to stroke. When feasible, brain MRI with and without contrast is recommended for further assessment and characterization, and urgent neurologist consultation is recommended. Critical Value/emergent results were called by telephone at the time of interpretation on 09/04/2015 at 12:32 pm to Dr. Julianne Rice, who verbally acknowledged these results. Electronically Signed   By: Van Clines M.D.   On: 09/04/2015 12:34   Ct Chest W Contrast  09/05/2015  CLINICAL DATA:  Patient with history of metastatic melanoma. Recently diagnosed with brain metastasis. EXAM: CT CHEST, ABDOMEN, AND PELVIS WITH CONTRAST TECHNIQUE: Multidetector CT imaging of the chest, abdomen and pelvis was performed following the standard protocol during bolus administration of intravenous contrast. CONTRAST:  165m OMNIPAQUE IOHEXOL 300 MG/ML  SOLN COMPARISON:  None. FINDINGS: CT CHEST FINDINGS Mediastinum/Lymph Nodes: Visualized thyroid is unremarkable. No axillary lymphadenopathy. Normal heart size. No pericardial effusion. There  is a 1.4 cm precarinal lymph node (image 22; series 2). Lungs/Pleura: Central airways are patent. Innumerable pulmonary nodules are demonstrated throughout the lungs bilaterally. Reference left lower lobe nodule measures 1.7 cm (image 46; series 3). Reference right middle lobe nodule measures 2.6 cm (image 42; series 3). No pleural effusion or pneumothorax. Musculoskeletal: No aggressive or acute appearing osseous lesions. CT ABDOMEN PELVIS FINDINGS Hepatobiliary: Liver is normal in size and contour. No definite focal lesion identified. Within the gallbladder lumen there is a 2.1 cm soft tissue mass. No gallbladder wall thickening or pericholecystic fluid. Pancreas: Unremarkable Spleen: Unremarkable Adrenals/Urinary Tract: Adrenal glands are normal. Malrotation of the left kidney. Too small to characterize low-attenuation lesions within the left and right kidneys. No hydronephrosis. Urinary bladder is unremarkable. Stomach/Bowel: Sigmoid colonic diverticulosis. No CT evidence for acute diverticulitis. No free fluid or free intraperitoneal air. Vascular/Lymphatic: Tortuous yet normal caliber abdominal aorta. Peripheral calcified atherosclerotic plaque. No retroperitoneal lymphadenopathy. Other: Uterus is  surgically absent. Small benign-appearing left adnexal cystic lesion. Musculoskeletal: Patchy lucencies within the superior aspect of the right and left ilium. Rightward curvature of the lumbar spine with associated degenerative disease. There is a 6.1 x 4.8 cm enhancing soft tissue mass within the right paraspinous musculature (image 76; series 2). IMPRESSION: Findings compatible with metastatic disease including innumerable bilateral pulmonary nodules, mediastinal adenopathy and right paraspinous soft tissue mass. Nonspecific lucencies within the right and left ilium may represent metastatic disease. Soft tissue mass within the gallbladder lumen may represent stone or potentially metastatic disease. Electronically Signed   By: Lovey Newcomer M.D.   On: 09/05/2015 13:03   Mr Jeri Cos MW Contrast  09/04/2015  CLINICAL DATA:  Slowly progressive visual loss, difficulty with cognition, and RIGHT-sided weakness. History of melanoma resection LEFT neck 2 years ago. EXAM: MRI HEAD WITHOUT AND WITH CONTRAST TECHNIQUE: Multiplanar, multiecho pulse sequences of the brain and surrounding structures were obtained without and with intravenous contrast. CONTRAST:  21m MULTIHANCE GADOBENATE DIMEGLUMINE 529 MG/ML IV SOLN COMPARISON:  CT head earlier today. FINDINGS: No acute stroke, hydrocephalus, or extra-axial fluid. As predicted from CT, there is an intracranial mass contributing to vasogenic edema. The mass is solitary, located in the LEFT occipital lobe, superficial, but intra-axial. There is T1 hyperintensity precontrast with avid enhancement postcontrast. The lesion measures 33 x 31 x 29 mm, and does share an inferior border with the LEFT transverse sinus. Overall the mass is hypointense on T2 weighted imaging. Gradient sequence does not demonstrate acute hemorrhage, but a few prominent vessels are noted. Early uncal herniation is noted, with LEFT-to-RIGHT shift of 7 mm at the anterior third ventricle level. Normal for age  cerebral volume. Minor white matter disease. No hydrocephalus or ventricular trapping. No tonsillar herniation. Extracranial soft tissues unremarkable. IMPRESSION: Solitary, greater than 3 cm, LEFT occipital intra-axial metastasis, demonstrating precontrast T1 shortening consistent with metastatic melanoma. Avid postcontrast enhancement and prominent intratumoral vessels suggest a highly vascular lesion. Extensive vasogenic edema with early LEFT uncal herniation and 7 mm LEFT-to-RIGHT shift. Electronically Signed   By: JStaci RighterM.D.   On: 09/04/2015 19:10   Ct Abdomen Pelvis W Contrast  09/05/2015  CLINICAL DATA:  Patient with history of metastatic melanoma. Recently diagnosed with brain metastasis. EXAM: CT CHEST, ABDOMEN, AND PELVIS WITH CONTRAST TECHNIQUE: Multidetector CT imaging of the chest, abdomen and pelvis was performed following the standard protocol during bolus administration of intravenous contrast. CONTRAST:  1069mOMNIPAQUE IOHEXOL 300 MG/ML  SOLN COMPARISON:  None. FINDINGS: CT CHEST  FINDINGS Mediastinum/Lymph Nodes: Visualized thyroid is unremarkable. No axillary lymphadenopathy. Normal heart size. No pericardial effusion. There is a 1.4 cm precarinal lymph node (image 22; series 2). Lungs/Pleura: Central airways are patent. Innumerable pulmonary nodules are demonstrated throughout the lungs bilaterally. Reference left lower lobe nodule measures 1.7 cm (image 46; series 3). Reference right middle lobe nodule measures 2.6 cm (image 42; series 3). No pleural effusion or pneumothorax. Musculoskeletal: No aggressive or acute appearing osseous lesions. CT ABDOMEN PELVIS FINDINGS Hepatobiliary: Liver is normal in size and contour. No definite focal lesion identified. Within the gallbladder lumen there is a 2.1 cm soft tissue mass. No gallbladder wall thickening or pericholecystic fluid. Pancreas: Unremarkable Spleen: Unremarkable Adrenals/Urinary Tract: Adrenal glands are normal. Malrotation of  the left kidney. Too small to characterize low-attenuation lesions within the left and right kidneys. No hydronephrosis. Urinary bladder is unremarkable. Stomach/Bowel: Sigmoid colonic diverticulosis. No CT evidence for acute diverticulitis. No free fluid or free intraperitoneal air. Vascular/Lymphatic: Tortuous yet normal caliber abdominal aorta. Peripheral calcified atherosclerotic plaque. No retroperitoneal lymphadenopathy. Other: Uterus is surgically absent. Small benign-appearing left adnexal cystic lesion. Musculoskeletal: Patchy lucencies within the superior aspect of the right and left ilium. Rightward curvature of the lumbar spine with associated degenerative disease. There is a 6.1 x 4.8 cm enhancing soft tissue mass within the right paraspinous musculature (image 76; series 2). IMPRESSION: Findings compatible with metastatic disease including innumerable bilateral pulmonary nodules, mediastinal adenopathy and right paraspinous soft tissue mass. Nonspecific lucencies within the right and left ilium may represent metastatic disease. Soft tissue mass within the gallbladder lumen may represent stone or potentially metastatic disease. Electronically Signed   By: Lovey Newcomer M.D.   On: 09/05/2015 13:03    Assessment and Plan:    82 year old woman with the following issues:  1. Metastatic malignancy presenting with brain metastasis and a solitary 3 cm occipital metastasis with vasogenic edema as well as systemic metastasis including pulmonary, paraspinal and possibly other areas. The differential diagnosis was discussed with the patient and here family today and distantly metastatic carcinoma from different primaries could be contemplated. Given her melanoma history, it is very possible were dealing with metastatic melanoma.  Her case was discussed today in the melanoma tumor board and her initial pathology from 2011 indicating a rather aggressive disease that could manifest itself with systemic and  CNS metastasis at this time.  Other malignant disease such as breast cancer, lung cancer among others could be a consideration.  To make a definitive diagnosis, the risks and benefits of obtaining a tissue biopsy was discussed. It would be easy to obtain sampling of her paraspinal mass and obtain BRAF testing if it's indeed melanoma.  Once this step is done, we can discuss treatment options at that time. These would include supportive care and hospice versus systemic therapy which could be in the form of chemotherapy, immunotherapy or oral targeted agents. There is wide variability of toxicities associated with these therapies and will be dictated by the final pathology.  Regardless of the etiology of her cancer, we are dealing with advanced stage IV disease that is incurable. It is possible to palliate her disease with minimum toxicity if she has a BRAF positive melanoma that could be treated with oral therapy at least temporarily. Despite that, we are likely dealing with a dismal diagnosis and very limited life expectancy.  The risks and benefits of treatments as well as alternatives such as hospice will be discussed further in the next visit once we  have more results.  2. CNS metastasis: She status post SRS treatment completed on 09/17/2015. She is currently on dexamethasone 4 mg every 6 hours. He will likely need to be tapered down very slowly. I will discuss this with Dr. Tammi Klippel regarding his plan of steroid taper. In  3. Follow-up: Will be in the next few weeks to discuss the results of her biopsy.

## 2015-09-19 NOTE — Telephone Encounter (Signed)
Gave and printed April appt

## 2015-09-19 NOTE — Telephone Encounter (Signed)
Gave and printed appt sched and avs for pt for April °

## 2015-09-21 NOTE — Addendum Note (Signed)
Encounter addended by: Tyler Pita, MD on: 09/21/2015  3:55 PM<BR>     Documentation filed: Notes Section

## 2015-09-23 ENCOUNTER — Ambulatory Visit: Payer: PPO | Admitting: Physician Assistant

## 2015-09-23 ENCOUNTER — Other Ambulatory Visit: Payer: Self-pay | Admitting: Radiology

## 2015-09-23 ENCOUNTER — Encounter (HOSPITAL_COMMUNITY): Payer: Self-pay | Admitting: Emergency Medicine

## 2015-09-23 ENCOUNTER — Inpatient Hospital Stay (HOSPITAL_COMMUNITY)
Admission: EM | Admit: 2015-09-23 | Discharge: 2015-10-27 | DRG: 193 | Disposition: E | Payer: PPO | Attending: Internal Medicine | Admitting: Internal Medicine

## 2015-09-23 ENCOUNTER — Emergency Department (HOSPITAL_COMMUNITY): Payer: PPO

## 2015-09-23 DIAGNOSIS — E876 Hypokalemia: Secondary | ICD-10-CM | POA: Diagnosis present

## 2015-09-23 DIAGNOSIS — E871 Hypo-osmolality and hyponatremia: Secondary | ICD-10-CM

## 2015-09-23 DIAGNOSIS — I25119 Atherosclerotic heart disease of native coronary artery with unspecified angina pectoris: Secondary | ICD-10-CM | POA: Diagnosis present

## 2015-09-23 DIAGNOSIS — G8929 Other chronic pain: Secondary | ICD-10-CM | POA: Diagnosis present

## 2015-09-23 DIAGNOSIS — Y95 Nosocomial condition: Secondary | ICD-10-CM | POA: Diagnosis present

## 2015-09-23 DIAGNOSIS — C7801 Secondary malignant neoplasm of right lung: Secondary | ICD-10-CM | POA: Diagnosis present

## 2015-09-23 DIAGNOSIS — C7802 Secondary malignant neoplasm of left lung: Secondary | ICD-10-CM | POA: Diagnosis present

## 2015-09-23 DIAGNOSIS — I480 Paroxysmal atrial fibrillation: Secondary | ICD-10-CM | POA: Diagnosis present

## 2015-09-23 DIAGNOSIS — Z800 Family history of malignant neoplasm of digestive organs: Secondary | ICD-10-CM

## 2015-09-23 DIAGNOSIS — C7931 Secondary malignant neoplasm of brain: Secondary | ICD-10-CM

## 2015-09-23 DIAGNOSIS — Z66 Do not resuscitate: Secondary | ICD-10-CM | POA: Diagnosis present

## 2015-09-23 DIAGNOSIS — Z85820 Personal history of malignant melanoma of skin: Secondary | ICD-10-CM

## 2015-09-23 DIAGNOSIS — J189 Pneumonia, unspecified organism: Principal | ICD-10-CM

## 2015-09-23 DIAGNOSIS — G936 Cerebral edema: Secondary | ICD-10-CM | POA: Diagnosis present

## 2015-09-23 DIAGNOSIS — R042 Hemoptysis: Secondary | ICD-10-CM

## 2015-09-23 DIAGNOSIS — Z955 Presence of coronary angioplasty implant and graft: Secondary | ICD-10-CM

## 2015-09-23 DIAGNOSIS — E43 Unspecified severe protein-calorie malnutrition: Secondary | ICD-10-CM | POA: Diagnosis present

## 2015-09-23 DIAGNOSIS — Z923 Personal history of irradiation: Secondary | ICD-10-CM

## 2015-09-23 DIAGNOSIS — Z7982 Long term (current) use of aspirin: Secondary | ICD-10-CM

## 2015-09-23 DIAGNOSIS — C439 Malignant melanoma of skin, unspecified: Secondary | ICD-10-CM | POA: Diagnosis present

## 2015-09-23 DIAGNOSIS — R002 Palpitations: Secondary | ICD-10-CM | POA: Diagnosis present

## 2015-09-23 DIAGNOSIS — Z90710 Acquired absence of both cervix and uterus: Secondary | ICD-10-CM

## 2015-09-23 DIAGNOSIS — D696 Thrombocytopenia, unspecified: Secondary | ICD-10-CM | POA: Diagnosis present

## 2015-09-23 DIAGNOSIS — I4891 Unspecified atrial fibrillation: Secondary | ICD-10-CM

## 2015-09-23 DIAGNOSIS — Z79899 Other long term (current) drug therapy: Secondary | ICD-10-CM

## 2015-09-23 DIAGNOSIS — R222 Localized swelling, mass and lump, trunk: Secondary | ICD-10-CM

## 2015-09-23 DIAGNOSIS — Z808 Family history of malignant neoplasm of other organs or systems: Secondary | ICD-10-CM

## 2015-09-23 DIAGNOSIS — E861 Hypovolemia: Secondary | ICD-10-CM | POA: Diagnosis present

## 2015-09-23 DIAGNOSIS — I10 Essential (primary) hypertension: Secondary | ICD-10-CM | POA: Diagnosis present

## 2015-09-23 DIAGNOSIS — D72829 Elevated white blood cell count, unspecified: Secondary | ICD-10-CM

## 2015-09-23 DIAGNOSIS — Z96653 Presence of artificial knee joint, bilateral: Secondary | ICD-10-CM | POA: Diagnosis present

## 2015-09-23 DIAGNOSIS — R0789 Other chest pain: Secondary | ICD-10-CM

## 2015-09-23 DIAGNOSIS — Z8052 Family history of malignant neoplasm of bladder: Secondary | ICD-10-CM

## 2015-09-23 DIAGNOSIS — Z885 Allergy status to narcotic agent status: Secondary | ICD-10-CM

## 2015-09-23 DIAGNOSIS — R079 Chest pain, unspecified: Secondary | ICD-10-CM | POA: Diagnosis not present

## 2015-09-23 DIAGNOSIS — Z888 Allergy status to other drugs, medicaments and biological substances status: Secondary | ICD-10-CM

## 2015-09-23 DIAGNOSIS — Z515 Encounter for palliative care: Secondary | ICD-10-CM | POA: Diagnosis not present

## 2015-09-23 DIAGNOSIS — Z8249 Family history of ischemic heart disease and other diseases of the circulatory system: Secondary | ICD-10-CM

## 2015-09-23 DIAGNOSIS — Z7952 Long term (current) use of systemic steroids: Secondary | ICD-10-CM

## 2015-09-23 DIAGNOSIS — M549 Dorsalgia, unspecified: Secondary | ICD-10-CM | POA: Diagnosis present

## 2015-09-23 HISTORY — DX: Chronic ischemic heart disease, unspecified: I25.9

## 2015-09-23 HISTORY — DX: Angina pectoris, unspecified: I20.9

## 2015-09-23 HISTORY — DX: Secondary malignant neoplasm of brain: C79.31

## 2015-09-23 MED ORDER — NITROGLYCERIN 2 % TD OINT
1.0000 [in_us] | TOPICAL_OINTMENT | Freq: Once | TRANSDERMAL | Status: AC
  Administered 2015-09-23: 1 [in_us] via TOPICAL
  Filled 2015-09-23: qty 1

## 2015-09-23 NOTE — ED Notes (Signed)
New report of CP today. Stabbing pain that partially radiates down L arm. Denies SOB, N/V/D, weakness, dizziness. Denies current CP after taking 324mg  ASA & 1 nitro per EMS, as well as nitro 2x at home. Recent dx of brain tumor. Had one bloody stool yesterday, but none today. SpO2 initially 91%RA for EMS, so they placed her on 2L O2 via [place]. Per EMS, showing 1st degree AVB w/ PACs

## 2015-09-23 NOTE — ED Provider Notes (Signed)
CSN: [id number]     Arrival date & time 08/31/2015  2219 History  By signing my name below, I, Helane Gunther, attest that this documentation has been prepared under the direction and in the presence of Rolland Porter, MD at 70AM. Electronically Signed: Helane Gunther, ED Scribe. 09/24/2015. 2:39 AM.      Chief Complaint  Patient presents with  . Chest Pain   The history is provided by the patient and the spouse. No language interpreter was used.   HPI Comments: Susan Clark is a 82 y.o. female with a PMHx of HTN and palpitations, as well as a PSHx of coronary angioplasty with stent placement (2004) and a cardiovascular stress test (2009) brought in by ambulance who presents to the Emergency Department complaining of sharp, constant, left-sided chest pain onset 5 hours ago, radiating into her left arm after she arrived in the ED. Pt states this began as mild soreness on the right side yesterday, then spread to the left side of her chest, where it gradually worsened. She notes she still has some, mild soreness on the right side of her chest. She states she has taken 2 NTG for the sharp, left-sided pain, noting that the first provided mild relief, but the second dose did not affect any changes. She states her NTG pills have expired, and that this was the first time she has taken them in several years. Per husband, pt was also given NTG by EMS. He notes pt has also taken one Ambien tonight at 8 PM. Pt states she is still having "just a little bit" of pain in her left chest. She notes exacerbation of the pain with twisting her torso. She denies any effect of deep breathing on the pain. She states the pain alleviated somewhat after eating. She states she has never had a similar pain before. She states she has a cardiologist, whom she sees for angina, and with whom she has an appointment in 2 days. She states she has been diagnosed with brain cancer, which is being treated at [facility] with  radiation treatments. She notes she has been "going downhill" for the past 2 weeks since being released form the hospital after having these treatments. She endorses loss of appetite and fatigue. Per husband, pt has needed much assistance and has mostly been sitting a lot since being released. He stats pt has been using her walker and has to be helped into and out of a seated position. Pt denies SOB, n/v, and diaphoresis. She denies pain or swelling in her legs.   PCP Dr Tollie Pizza Cardiology Dr Mare Ferrari  Past Medical History  Diagnosis Date  . Palpitations     occasional  . Chronic back pain   . Hypertension    Past Surgical History  Procedure Laterality Date  . Tonsillectomy and adenoidectomy    . Partial hysterectomy  1971  . Anterior cervical discectomy  03/28/2006    C4-C5  . Cardiovascular stress test  09/13/2007    EF 74%, normal study  . Coronary angioplasty with stent placement  2004    UNKNOWN WHICH TYPE   Family History  Problem Relation Age of Onset  . Heart disease Father   . Arthritis Father   . Heart attack Father   . Cancer Sister     colon  . Colon cancer Sister   . Hypertension Mother   . Cancer Mother     lung cancer  . Emphysema Brother   . Bone  cancer Brother   . Bladder Cancer Brother    Social History  Substance Use Topics  . Smoking status: Never Smoker   . Smokeless tobacco: None  . Alcohol Use: No   Lives at home Lives with spouse  OB History    No data available     Review of Systems  Constitutional: Positive for activity change, appetite change and fatigue. Negative for diaphoresis.  Respiratory: Negative for shortness of breath.   Cardiovascular: Positive for chest pain.  Gastrointestinal: Negative for nausea and vomiting.  Neurological: Positive for weakness.  All other systems reviewed and are negative.     Allergies  Buprenorphine hcl; Morphine and related; and Prednisone  Home Medications   Prior to Admission medications    Medication Sig Start Date End Date Taking? Authorizing Provider  amLODipine (NORVASC) 5 MG tablet Take 1 tablet (5 mg total) by mouth daily. 10/30/14  Yes Darlin Coco, MD  aspirin EC 325 MG tablet Take 1 tablet (325 mg total) by mouth daily. 09/08/15  Yes Bonnielee Haff, MD  dexamethasone (DECADRON) 4 MG tablet Take 1 tablet (4 mg total) by mouth every 6 (six) hours. 09/08/15  Yes Bonnielee Haff, MD  nitroGLYCERIN (NITROSTAT) 0.4 MG SL tablet Place 1 tablet (0.4 mg total) under the tongue every 5 (five) minutes as needed for chest pain. 09/05/13  Yes Darlin Coco, MD  Probiotic Product (PROBIOTIC DAILY PO) Take 1 tablet by mouth daily.    Yes Historical Provider, MD  propranolol (INDERAL) 40 MG tablet Take 1 tablet (40 mg total) by mouth 2 (two) times daily. 08/18/15  Yes Darlin Coco, MD  simvastatin (ZOCOR) 20 MG tablet Take 1 tablet (20 mg total) by mouth at bedtime. 10/30/14  Yes Darlin Coco, MD  zolpidem (AMBIEN) 10 MG tablet Take 1 tablet (10 mg total) by mouth at bedtime as needed for sleep. 07/31/15  Yes Darlin Coco, MD   BP 144/66 mmHg  Pulse 58  Temp(Src) 98.7 F (37.1 C) (Oral)  Resp 22  SpO2 94%  Vital signs normal except bradycardia  Physical Exam  Constitutional: She is oriented to person, place, and time. She appears well-developed and well-nourished.  Non-toxic appearance. She does not appear ill. No distress.  Patient appears sleepy and is slow to respond, husband states she took her Ambien at 8 PM tonight.  HENT:  Head: Normocephalic and atraumatic.  Right Ear: External ear normal.  Left Ear: External ear normal.  Nose: Nose normal. No mucosal edema or rhinorrhea.  Mouth/Throat: Oropharynx is clear and moist and mucous membranes are normal. No dental abscesses or uvula swelling.  Eyes: Conjunctivae and EOM are normal. Pupils are equal, round, and reactive to light.  Neck: Normal range of motion and full passive range of motion without pain. Neck supple.   Cardiovascular: Normal rate, regular rhythm and normal heart sounds.  Exam reveals no gallop and no friction rub.   No murmur heard. Pulmonary/Chest: Effort normal and breath sounds normal. No respiratory distress. She has no wheezes. She has no rhonchi. She has no rales. She exhibits no tenderness and no crepitus.  Abdominal: Soft. Normal appearance and bowel sounds are normal. She exhibits no distension. There is no tenderness. There is no rebound and no guarding.  Musculoskeletal: Normal range of motion. She exhibits no edema or tenderness.  Moves all extremities well.   Neurological: She is alert and oriented to person, place, and time. She has normal strength. No cranial nerve deficit.  Skin: Skin is warm,  dry and intact. No rash noted. No erythema. There is pallor.  Psychiatric: She has a normal mood and affect. Her speech is normal and behavior is normal. Her mood appears not anxious.  Nursing note and vitals reviewed.   ED Course  Procedures   Medications  nitroGLYCERIN (NITROGLYN) 2 % ointment 1 inch (1 inch Topical Given 09/07/2015 2352)  ceFEPIme (MAXIPIME) 1 g in dextrose 5 % 50 mL IVPB (0 g Intravenous Stopped 09/24/15 0226)  vancomycin (VANCOCIN) IVPB 1000 mg/200 mL premix (0 mg Intravenous Stopped 09/24/15 0333)    DIAGNOSTIC STUDIES: Oxygen Saturation is 95% on RA, adequate by my interpretation.    COORDINATION OF CARE: 11:39 PM - Discussed EKG results and plans to order diagnostic studies. Will order NTG paste. Pt advised of plan for treatment and pt agrees.  2:21 AM - Discussed lab and XR results, probable PNA, and plans to consult with the hospitalist about possibly admitting pt. Per relatives, pt's cancer has not spread into the bloodstream. Pt advised of plan for treatment and pt agrees. Patient was started on healthcare associated pneumonia antibiotics since she is on steroids and has been getting radiation treatment to her brain.  2:37 AM - Spoke with Dr Loleta Books who  agreed to admit pt for observation to med-surg.   Results for orders placed or performed during the hospital encounter of 09/08/2015  Comprehensive metabolic panel  Result Value Ref Range   Sodium 124 (L) 135 - 145 mmol/L   Potassium 3.9 3.5 - 5.1 mmol/L   Chloride 85 (L) 101 - 111 mmol/L   CO2 29 22 - 32 mmol/L   Glucose, Bld 90 65 - 99 mg/dL   BUN 11 6 - 20 mg/dL   Creatinine, Ser 0.56 0.44 - 1.00 mg/dL   Calcium 8.2 (L) 8.9 - 10.3 mg/dL   Total Protein 5.0 (L) 6.5 - 8.1 g/dL   Albumin 2.4 (L) 3.5 - 5.0 g/dL   AST 18 15 - 41 U/L   ALT 18 14 - 54 U/L   Alkaline Phosphatase 53 38 - 126 U/L   Total Bilirubin 1.3 (H) 0.3 - 1.2 mg/dL   GFR calc non Af Amer >60 >60 mL/min   GFR calc Af Amer >60 >60 mL/min   Anion gap 10 5 - 15  CBC with Differential  Result Value Ref Range   WBC 51.0 (HH) 4.0 - 10.5 K/uL   RBC 5.82 (H) 3.87 - 5.11 MIL/uL   Hemoglobin 14.5 12.0 - 15.0 g/dL   HCT 42.8 36.0 - 46.0 %   MCV 73.5 (L) 78.0 - 100.0 fL   MCH 24.9 (L) 26.0 - 34.0 pg   MCHC 33.9 30.0 - 36.0 g/dL   RDW 15.8 (H) 11.5 - 15.5 %   Platelets 138 (L) 150 - 400 K/uL   Neutrophils Relative % 75 %   Lymphocytes Relative 16 %   Monocytes Relative 9 %   Eosinophils Relative 0 %   Basophils Relative 0 %   Neutro Abs 38.2 (H) 1.7 - 7.7 K/uL   Lymphs Abs 8.2 (H) 0.7 - 4.0 K/uL   Monocytes Absolute 4.6 (H) 0.1 - 1.0 K/uL   Eosinophils Absolute 0.0 0.0 - 0.7 K/uL   Basophils Absolute 0.0 0.0 - 0.1 K/uL   RBC Morphology TEARDROP CELLS    WBC Morphology TOXIC GRANULATION   I-stat troponin, ED  Result Value Ref Range   Troponin i, poc 0.03 0.00 - 0.08 ng/mL   Comment 3  Laboratory interpretation all normal except marked increase of her baseline leukocytosis of 20-30,000 now with toxic granulation, worsening of her baseline hyponatremia and low chloride   Dg Chest Port 1 View  09/24/2015  CLINICAL DATA:  82 year old female with chest pain. EXAM: PORTABLE CHEST 1 VIEW COMPARISON:  Chest CT  dated 09/05/2015 FINDINGS: Single-view of the chest demonstrate mild diffuse airspace haziness in the left lung predominantly involving the left lung base concerning for pneumonia. Multiple bilateral pulmonary nodules as seen on the prior study and most compatible with metastatic disease. There is no pleural effusion or pneumothorax. The cardiac silhouette is within normal limits. No acute osseous pathology identified. IMPRESSION: Diffuse airspace haziness in the left lung most concerning for developing pneumonia. Multiple bilateral pulmonary nodules compatible with metastatic disease Electronically Signed   By: Anner Crete M.D.   On: 09/24/2015 00:19    Dg Chest 2 View  09/04/2015  CLINICAL DATA:  Leukocytosis. Severe vasogenic edema in the left cerebral hemisphere. EXAM: CHEST  2 VIEW COMPARISON:  09/18/2007 IMPRESSION: 1. Numerous scattered bilateral pulmonary nodules and a right basilar pulmonary mass. Appearance favors metastatic disease to both lungs, especially in light of the vasogenic edema in the brain. 2. Atherosclerotic aortic arch. Electronically Signed   By: Van Clines M.D.   On: 09/04/2015 15:22   Ct Head Wo Contrast  09/04/2015  CLINICAL DATA:  Frontal headache and weakness all over for several weeks. . IMPRESSION: 1. Severe vasogenic edema in the left temporal, parietal, and occipital lobes associated with sulcal effacement in the left cerebral hemisphere, 7 mm of left-to-right midline shift, and some effacement of the ambient cistern and left lateral ventricle. This is a nonspecific but severe appearance which could reflect underlying tumor, encephalitis, or other severe inflammatory process. Pattern less likely to be due to stroke. When feasible, brain MRI with and without contrast is recommended for further assessment and characterization, and urgent neurologist consultation is recommended. Critical Value/emergent results were called by telephone at the time of interpretation on  09/04/2015 at 12:32 pm to Dr. Julianne Rice, who verbally acknowledged these results. Electronically Signed   By: Van Clines M.D.   On: 09/04/2015 12:34   Ct Chest W Contrast  09/05/2015  CLINICAL DATA:  Patient with history of metastatic melanoma. Recently diagnosed with brain metastasis. :IMPRESSION: Findings compatible with metastatic disease including innumerable bilateral pulmonary nodules, mediastinal adenopathy and right paraspinous soft tissue mass. Nonspecific lucencies within the right and left ilium may represent metastatic disease. Soft tissue mass within the gallbladder lumen may represent stone or potentially metastatic disease. Electronically Signed   By: Lovey Newcomer M.D.   On: 09/05/2015 13:03   Mr Jeri Cos [id number] Contrast  09/04/2015  CLINICAL DATA:  Slowly progressive visual loss, difficulty with cognition, and RIGHT-sided weakness. History of melanoma resection LEFT neck 2 years ago.IMPRESSION: Solitary, greater than 3 cm, LEFT occipital intra-axial metastasis, demonstrating precontrast T1 shortening consistent with metastatic melanoma. Avid postcontrast enhancement and prominent intratumoral vessels suggest a highly vascular lesion. Extensive vasogenic edema with early LEFT uncal herniation and 7 mm LEFT-to-RIGHT shift. Electronically Signed   By: Staci Righter M.D.   On: 09/04/2015 19:10   Ct Abdomen Pelvis W Contrast  09/05/2015  CLINICAL DATA:  Patient with history of metastatic melanoma. Recently diagnosed with brain metastasis.. IMPRESSION: Findings compatible with metastatic disease including innumerable bilateral pulmonary nodules, mediastinal adenopathy and right paraspinous soft tissue mass. Nonspecific lucencies within the right and left ilium may represent metastatic disease.  Soft tissue mass within the gallbladder lumen may represent stone or potentially metastatic disease. Electronically Signed   By: Lovey Newcomer M.D.   On: 09/05/2015 13:03        EKG  Interpretation   Date/Time:  Tuesday September 23 2015 22:26:03 EDT Ventricular Rate:  60 PR Interval:  225 QRS Duration: 95 QT Interval:  434 QTC Calculation: 434 R Axis:   -33 Text Interpretation:  Sinus rhythm Atrial premature complex Prolonged PR  interval LVH with secondary repolarization abnormality Anterior Q waves,  possibly due to LVH Baseline wander in lead(s) V4 Sinus rhythm Premature  atrial complexes Artifact Left ventricular hypertrophy No significant  change since last tracing Abnormal ekg Confirmed by Carmin Muskrat  MD  [phone]) on 09/07/2015 10:41:17 PM      MDM   patient presents with a history of melanoma that was removed from her left neck several years ago. She presented in early March with strokelike symptoms and was found to have metastasis to her brain with shift. She has undergone 3 radiation treatments and is on steroids. She is noted to have metastatic disease with involvement of her chest and abdomen. Patient presents tonight with chest pain and a marked increase of her baseline leukocytosis of 20-30,000-50,000. She also is noted to have toxic granulation. Her chest x-ray was suggestive of a left infiltrate and she was started on healthcare associated antibiotics. She is being admitted to observation due to her underlying medical condition and the marked leukocytosis she now has.    Final diagnoses:  Healthcare-associated pneumonia  Hyponatremia  Leukocytosis  Chest pain, atypical    Plan admission to observation   Rolland Porter, MD, FACEP  I personally performed the services described in this documentation, which was scribed in my presence. The recorded information has been reviewed and considered.  Rolland Porter, MD, Barbette Or, MD 09/24/15 [phone]

## 2015-09-24 ENCOUNTER — Encounter (HOSPITAL_COMMUNITY): Payer: Self-pay | Admitting: Family Medicine

## 2015-09-24 ENCOUNTER — Ambulatory Visit (HOSPITAL_COMMUNITY): Admission: RE | Admit: 2015-09-24 | Payer: PPO | Source: Ambulatory Visit

## 2015-09-24 DIAGNOSIS — E43 Unspecified severe protein-calorie malnutrition: Secondary | ICD-10-CM | POA: Diagnosis present

## 2015-09-24 DIAGNOSIS — I4891 Unspecified atrial fibrillation: Secondary | ICD-10-CM | POA: Diagnosis not present

## 2015-09-24 DIAGNOSIS — G8929 Other chronic pain: Secondary | ICD-10-CM | POA: Diagnosis present

## 2015-09-24 DIAGNOSIS — E861 Hypovolemia: Secondary | ICD-10-CM | POA: Diagnosis present

## 2015-09-24 DIAGNOSIS — M549 Dorsalgia, unspecified: Secondary | ICD-10-CM | POA: Diagnosis present

## 2015-09-24 DIAGNOSIS — R0789 Other chest pain: Secondary | ICD-10-CM | POA: Diagnosis not present

## 2015-09-24 DIAGNOSIS — Z8052 Family history of malignant neoplasm of bladder: Secondary | ICD-10-CM | POA: Diagnosis not present

## 2015-09-24 DIAGNOSIS — I25119 Atherosclerotic heart disease of native coronary artery with unspecified angina pectoris: Secondary | ICD-10-CM | POA: Diagnosis present

## 2015-09-24 DIAGNOSIS — J189 Pneumonia, unspecified organism: Secondary | ICD-10-CM | POA: Diagnosis present

## 2015-09-24 DIAGNOSIS — Z955 Presence of coronary angioplasty implant and graft: Secondary | ICD-10-CM | POA: Diagnosis not present

## 2015-09-24 DIAGNOSIS — C801 Malignant (primary) neoplasm, unspecified: Secondary | ICD-10-CM | POA: Diagnosis not present

## 2015-09-24 DIAGNOSIS — E871 Hypo-osmolality and hyponatremia: Secondary | ICD-10-CM | POA: Diagnosis not present

## 2015-09-24 DIAGNOSIS — Z800 Family history of malignant neoplasm of digestive organs: Secondary | ICD-10-CM | POA: Diagnosis not present

## 2015-09-24 DIAGNOSIS — I480 Paroxysmal atrial fibrillation: Secondary | ICD-10-CM | POA: Diagnosis not present

## 2015-09-24 DIAGNOSIS — I10 Essential (primary) hypertension: Secondary | ICD-10-CM | POA: Diagnosis present

## 2015-09-24 DIAGNOSIS — D72829 Elevated white blood cell count, unspecified: Secondary | ICD-10-CM

## 2015-09-24 DIAGNOSIS — R002 Palpitations: Secondary | ICD-10-CM | POA: Diagnosis present

## 2015-09-24 DIAGNOSIS — E876 Hypokalemia: Secondary | ICD-10-CM | POA: Diagnosis present

## 2015-09-24 DIAGNOSIS — Z8249 Family history of ischemic heart disease and other diseases of the circulatory system: Secondary | ICD-10-CM | POA: Diagnosis not present

## 2015-09-24 DIAGNOSIS — D696 Thrombocytopenia, unspecified: Secondary | ICD-10-CM | POA: Diagnosis present

## 2015-09-24 DIAGNOSIS — Z515 Encounter for palliative care: Secondary | ICD-10-CM | POA: Diagnosis not present

## 2015-09-24 DIAGNOSIS — C7802 Secondary malignant neoplasm of left lung: Secondary | ICD-10-CM | POA: Diagnosis present

## 2015-09-24 DIAGNOSIS — Z85820 Personal history of malignant melanoma of skin: Secondary | ICD-10-CM | POA: Diagnosis not present

## 2015-09-24 DIAGNOSIS — Z79899 Other long term (current) drug therapy: Secondary | ICD-10-CM | POA: Diagnosis not present

## 2015-09-24 DIAGNOSIS — Y95 Nosocomial condition: Secondary | ICD-10-CM | POA: Diagnosis present

## 2015-09-24 DIAGNOSIS — R042 Hemoptysis: Secondary | ICD-10-CM | POA: Diagnosis present

## 2015-09-24 DIAGNOSIS — C7931 Secondary malignant neoplasm of brain: Secondary | ICD-10-CM | POA: Diagnosis present

## 2015-09-24 DIAGNOSIS — Z7952 Long term (current) use of systemic steroids: Secondary | ICD-10-CM | POA: Diagnosis not present

## 2015-09-24 DIAGNOSIS — C439 Malignant melanoma of skin, unspecified: Secondary | ICD-10-CM | POA: Diagnosis present

## 2015-09-24 DIAGNOSIS — Z96653 Presence of artificial knee joint, bilateral: Secondary | ICD-10-CM | POA: Diagnosis present

## 2015-09-24 DIAGNOSIS — Z923 Personal history of irradiation: Secondary | ICD-10-CM | POA: Diagnosis not present

## 2015-09-24 DIAGNOSIS — R918 Other nonspecific abnormal finding of lung field: Secondary | ICD-10-CM | POA: Diagnosis not present

## 2015-09-24 DIAGNOSIS — C7801 Secondary malignant neoplasm of right lung: Secondary | ICD-10-CM | POA: Diagnosis present

## 2015-09-24 DIAGNOSIS — Z66 Do not resuscitate: Secondary | ICD-10-CM | POA: Diagnosis present

## 2015-09-24 DIAGNOSIS — Z885 Allergy status to narcotic agent status: Secondary | ICD-10-CM | POA: Diagnosis not present

## 2015-09-24 DIAGNOSIS — Z808 Family history of malignant neoplasm of other organs or systems: Secondary | ICD-10-CM | POA: Diagnosis not present

## 2015-09-24 DIAGNOSIS — Z7982 Long term (current) use of aspirin: Secondary | ICD-10-CM | POA: Diagnosis not present

## 2015-09-24 DIAGNOSIS — Z888 Allergy status to other drugs, medicaments and biological substances status: Secondary | ICD-10-CM | POA: Diagnosis not present

## 2015-09-24 DIAGNOSIS — R079 Chest pain, unspecified: Secondary | ICD-10-CM | POA: Diagnosis present

## 2015-09-24 DIAGNOSIS — G936 Cerebral edema: Secondary | ICD-10-CM | POA: Diagnosis present

## 2015-09-24 DIAGNOSIS — Z90710 Acquired absence of both cervix and uterus: Secondary | ICD-10-CM | POA: Diagnosis not present

## 2015-09-24 LAB — CBC WITH DIFFERENTIAL/PLATELET
BASOS PCT: 0 %
Basophils Absolute: 0 10*3/uL (ref 0.0–0.1)
EOS ABS: 0 10*3/uL (ref 0.0–0.7)
Eosinophils Relative: 0 %
HEMATOCRIT: 42.8 % (ref 36.0–46.0)
Hemoglobin: 14.5 g/dL (ref 12.0–15.0)
LYMPHS ABS: 8.2 10*3/uL — AB (ref 0.7–4.0)
Lymphocytes Relative: 16 %
MCH: 24.9 pg — ABNORMAL LOW (ref 26.0–34.0)
MCHC: 33.9 g/dL (ref 30.0–36.0)
MCV: 73.5 fL — ABNORMAL LOW (ref 78.0–100.0)
MONO ABS: 4.6 10*3/uL — AB (ref 0.1–1.0)
Monocytes Relative: 9 %
NEUTROS PCT: 75 %
Neutro Abs: 38.2 10*3/uL — ABNORMAL HIGH (ref 1.7–7.7)
Platelets: 138 10*3/uL — ABNORMAL LOW (ref 150–400)
RBC: 5.82 MIL/uL — AB (ref 3.87–5.11)
RDW: 15.8 % — AB (ref 11.5–15.5)
WBC: 51 10*3/uL — AB (ref 4.0–10.5)

## 2015-09-24 LAB — COMPREHENSIVE METABOLIC PANEL
ALK PHOS: 53 U/L (ref 38–126)
ALT: 18 U/L (ref 14–54)
AST: 18 U/L (ref 15–41)
Albumin: 2.4 g/dL — ABNORMAL LOW (ref 3.5–5.0)
Anion gap: 10 (ref 5–15)
BUN: 11 mg/dL (ref 6–20)
CALCIUM: 8.2 mg/dL — AB (ref 8.9–10.3)
CO2: 29 mmol/L (ref 22–32)
CREATININE: 0.56 mg/dL (ref 0.44–1.00)
Chloride: 85 mmol/L — ABNORMAL LOW (ref 101–111)
Glucose, Bld: 90 mg/dL (ref 65–99)
Potassium: 3.9 mmol/L (ref 3.5–5.1)
Sodium: 124 mmol/L — ABNORMAL LOW (ref 135–145)
Total Bilirubin: 1.3 mg/dL — ABNORMAL HIGH (ref 0.3–1.2)
Total Protein: 5 g/dL — ABNORMAL LOW (ref 6.5–8.1)

## 2015-09-24 LAB — OSMOLALITY: OSMOLALITY: 255 mosm/kg — AB (ref 275–295)

## 2015-09-24 LAB — PROTIME-INR
INR: 1.27 (ref 0.00–1.49)
Prothrombin Time: 16.1 seconds — ABNORMAL HIGH (ref 11.6–15.2)

## 2015-09-24 LAB — I-STAT TROPONIN, ED: TROPONIN I, POC: 0.03 ng/mL (ref 0.00–0.08)

## 2015-09-24 LAB — APTT: APTT: 26 s (ref 24–37)

## 2015-09-24 MED ORDER — ACETAMINOPHEN 650 MG RE SUPP: 650.0000 mg | Freq: Four times a day (QID) | RECTAL | Status: DC | PRN

## 2015-09-24 MED ORDER — FLORA-Q PO CAPS
1.0000 | ORAL_CAPSULE | Freq: Every day | ORAL | Status: DC
  Filled 2015-09-24: qty 1

## 2015-09-24 MED ORDER — DEXTROSE 5 % IV SOLN
2.0000 g | INTRAVENOUS | Status: DC
  Administered 2015-09-24 – 2015-09-29 (×6): 2 g via INTRAVENOUS
  Filled 2015-09-24 (×7): qty 2

## 2015-09-24 MED ORDER — ACETAMINOPHEN 325 MG PO TABS
650.0000 mg | ORAL_TABLET | Freq: Four times a day (QID) | ORAL | Status: DC | PRN
  Administered 2015-09-28 – 2015-09-29 (×2): 650 mg via ORAL
  Filled 2015-09-24 (×2): qty 2

## 2015-09-24 MED ORDER — SODIUM CHLORIDE 0.9 % IV SOLN
500.0000 mg | Freq: Two times a day (BID) | INTRAVENOUS | Status: DC
  Filled 2015-09-24 (×2): qty 500

## 2015-09-24 MED ORDER — ZOLPIDEM TARTRATE 5 MG PO TABS
5.0000 mg | ORAL_TABLET | Freq: Once | ORAL | Status: AC
  Administered 2015-09-24: 5 mg via ORAL
  Filled 2015-09-24: qty 1

## 2015-09-24 MED ORDER — ONDANSETRON HCL 4 MG/2ML IJ SOLN: 4.0000 mg | Freq: Four times a day (QID) | INTRAMUSCULAR | Status: DC | PRN

## 2015-09-24 MED ORDER — SODIUM CHLORIDE 0.9 % IV SOLN
INTRAVENOUS | Status: DC
  Administered 2015-09-24: 75 mL/h via INTRAVENOUS
  Administered 2015-09-24: 23:00:00 via INTRAVENOUS

## 2015-09-24 MED ORDER — CEFEPIME HCL 1 G IJ SOLR
1.0000 g | Freq: Once | INTRAMUSCULAR | Status: AC
  Administered 2015-09-24: 1 g via INTRAVENOUS
  Filled 2015-09-24: qty 1

## 2015-09-24 MED ORDER — ONDANSETRON HCL 4 MG PO TABS: 4.0000 mg | ORAL_TABLET | Freq: Four times a day (QID) | ORAL | Status: DC | PRN

## 2015-09-24 MED ORDER — VANCOMYCIN HCL IN DEXTROSE 1-5 GM/200ML-% IV SOLN
1000.0000 mg | Freq: Once | INTRAVENOUS | Status: AC
  Administered 2015-09-24: 1000 mg via INTRAVENOUS
  Filled 2015-09-24: qty 200

## 2015-09-24 MED ORDER — ASPIRIN EC 325 MG PO TBEC
325.0000 mg | DELAYED_RELEASE_TABLET | Freq: Every day | ORAL | Status: DC
  Administered 2015-09-26 – 2015-09-28 (×3): 325 mg via ORAL
  Filled 2015-09-24 (×4): qty 1

## 2015-09-24 MED ORDER — DEXAMETHASONE 4 MG PO TABS
4.0000 mg | ORAL_TABLET | Freq: Four times a day (QID) | ORAL | Status: DC
  Administered 2015-09-24 – 2015-09-30 (×24): 4 mg via ORAL
  Filled 2015-09-24 (×28): qty 1

## 2015-09-24 NOTE — Progress Notes (Signed)
  PROGRESS NOTE  Susan Clark [id number] DOB: 05-17-1929 DOA: 09/18/2015 PCP: Stephens Shire, MD  Summary: 82 year old woman with metastatic disease of unclear primary at this point he presented with left-sided chest pain, cough and weakness. Chest x-ray suggested developing pneumonia and she was admitted for further treatment.  Assessment/Plan: 1. Pneumonia seen on chest x-ray with left-sided chest pain. Troponin negative. Marked leukocytosis likely multifactorial including infection as well as steroids and possibly also of malignancy. Hemodynamics are stable and there is no evidence to suggest sepsis at this time. 2. Metastatic malignancy with brain metastasis with vasogenic edema, as well as pulmonary paraspinal metastases. CNS metastasis was treated with radiation therapy 3/22. She continues on dexamethasone 4 times daily. 3. Chronic hyponatremia. Secondary to malignancy. Asymptomatic. Probably has a component of hypovolemia.   Hemodynamics are stable. No hypoxia. Appears to be responding treatments.  Continue IV antibiotics. MRSA PCA was negative within the last 2 weeks, EDC vancomycin. Check CBC in the morning.  Per oncology proceed with biopsy as planned when able. Interventional radiology has deferred per 24 hours.  IV fluids. Repeat BMP in the morning.  Code Status: DNR DVT prophylaxis: SCDs Family Communication: son and husband at bedside Disposition Plan: home  Murray Hodgkins, MD  [facility] Direct contact:  --[place] [place]  --[email or web address]; password [email or web address] and click  [phone] contact night coverage as above 09/24/2015, 3:55 PM  LOS: 0 days   Consultants:    Procedures:    Antibiotics:   Cefepime 3/29 >>  Vancomycin 3/29 >>  HPI/Subjective:  Feels better. Breathing better.  Objective: Filed Vitals:   09/24/15 1145 09/24/15 1215 09/24/15 1245 09/24/15 1544  BP: 146/67 141/67 156/63 129/58  Pulse: 63 57 55 51  Temp:    97.8 F (36.6 C)    TempSrc:      Resp: 15 18 16 19   SpO2: 95% 94% 96% 95%    Exam:    General:  Appears calm and comfortable sitting on side of bed Cardiovascular: RRR, no m/r/g. No LE edema. Respiratory: CTA bilaterally, no w/r/r. Normal respiratory effort. Psychiatric: grossly normal mood and affect, speech fluent and appropriate Neurologic: grossly non-focal.  New data reviewed:  Sodium 124 remainder BMP and hepatic function panel unremarkable  Troponins negative  WBC 51  Chest x-ray independently reviewed, left lower lobe infiltrate.  Scheduled Meds: . aspirin EC  325 mg Oral Daily  . ceFEPime (MAXIPIME) IV  2 g Intravenous Q24H  . dexamethasone  4 mg Oral 4 times per day  . [START ON 09/25/2015] FLORA-Q  1 capsule Oral Daily  . vancomycin  500 mg Intravenous Q12H   Continuous Infusions: . sodium chloride 75 mL/hr (09/24/15 0530)    Principal Problem:   Healthcare-associated pneumonia Active Problems:   Metastatic melanoma of brain (HCC)   Hyponatremia   HCAP (healthcare-associated pneumonia)   Time spent 20 minutes

## 2015-09-24 NOTE — H&P (Addendum)
Chief Complaint: Paraspinal mass  Referring Physician(s): Zola Button  Supervising Physician: Daryll Brod  History of Present Illness: Susan Clark is a 82 y.o. female with a past medical history significant for CAD and metastatic melanoma who presented to the ED this morning with chest pain and cough and weakness.  She was scheduled to undergo a CT guided biopsy of a paraspinal mass today by our service.  She has a history several years ago of cutaneous melanoma, and earlier this month was diagnosed with new brain mass (she presented to [facility] on 3/9 with focal weakness and ataxia and visual problems), as well as pulmonary nodules and the paraspinal mass.   Metastatic melanoma is favored although metastasis from a different primary is also considered.  Since discharge from the hospital on 3/13, she has been feeling weaker and weaker.   Over the last several days she has been somewhat lethargic she can't get up out of a chair without assistance.   Several days ago she developed cough, and today she developed a sharp chest pain, intermittent, severe, worse with movement, and so she came to the ED.  Upon arrival here she was afebrile and hemodynamically stable.   Chest x-ray showed patchy opacities in the left lung, new from 2 weeks ago.   Her WBC 51K  Vancomycin and cefepime were ordered for HCAP and TRH has been asked to evaluate for admission.   Past Medical History  Diagnosis Date  . Palpitations     occasional  . Chronic back pain   . Hypertension     Past Surgical History  Procedure Laterality Date  . Tonsillectomy and adenoidectomy    . Partial hysterectomy  1971  . Anterior cervical discectomy  03/28/2006    C4-C5  . Cardiovascular stress test  09/13/2007    EF 74%, normal study  . Coronary angioplasty with stent placement  2004    UNKNOWN WHICH TYPE    Allergies: Buprenorphine hcl; Morphine and related; and Prednisone  Medications: Prior to  Admission medications   Medication Sig Start Date End Date Taking? Authorizing Provider  amLODipine (NORVASC) 5 MG tablet Take 1 tablet (5 mg total) by mouth daily. 10/30/14  Yes Darlin Coco, MD  aspirin EC 325 MG tablet Take 1 tablet (325 mg total) by mouth daily. 09/08/15  Yes Bonnielee Haff, MD  dexamethasone (DECADRON) 4 MG tablet Take 1 tablet (4 mg total) by mouth every 6 (six) hours. 09/08/15  Yes Bonnielee Haff, MD  nitroGLYCERIN (NITROSTAT) 0.4 MG SL tablet Place 1 tablet (0.4 mg total) under the tongue every 5 (five) minutes as needed for chest pain. 09/05/13  Yes Darlin Coco, MD  Probiotic Product (PROBIOTIC DAILY PO) Take 1 tablet by mouth daily.    Yes Historical Provider, MD  propranolol (INDERAL) 40 MG tablet Take 1 tablet (40 mg total) by mouth 2 (two) times daily. 08/18/15  Yes Darlin Coco, MD  simvastatin (ZOCOR) 20 MG tablet Take 1 tablet (20 mg total) by mouth at bedtime. 10/30/14  Yes Darlin Coco, MD  zolpidem (AMBIEN) 10 MG tablet Take 1 tablet (10 mg total) by mouth at bedtime as needed for sleep. 07/31/15  Yes Darlin Coco, MD     Family History  Problem Relation Age of Onset  . Heart disease Father   . Arthritis Father   . Heart attack Father   . Cancer Sister     colon  . Colon cancer Sister   . Hypertension Mother   .  Cancer Mother     lung cancer  . Emphysema Brother   . Bone cancer Brother   . Bladder Cancer Brother     Social History   Social History  . Marital Status: Married    Spouse Name: N/A  . Number of Children: 4  . Years of Education: N/A   Occupational History  .     Social History Main Topics  . Smoking status: Never Smoker   . Smokeless tobacco: None  . Alcohol Use: No  . Drug Use: None  . Sexual Activity: Not Asked   Other Topics Concern  . None   Social History Narrative    Review of Systems: A 12 point ROS discussed  Review of Systems  Constitutional: Positive for activity change, appetite change and  fatigue. Negative for fever and chills.  HENT: Negative.   Eyes: Positive for visual disturbance.  Respiratory: Positive for cough.   Cardiovascular: Positive for chest pain.  Gastrointestinal: Negative for nausea, vomiting and abdominal pain.  Genitourinary: Negative.   Musculoskeletal: Negative.   Skin: Negative.   Neurological: Positive for weakness.  Psychiatric/Behavioral: Negative.     Vital Signs: BP 148/80 mmHg  Pulse 68  Temp(Src) 98.7 F (37.1 C) (Oral)  Resp 17  SpO2 94%  Physical Exam  Constitutional: She is oriented to person, place, and time. She appears well-developed and well-nourished.  Sitting on side of the stretcher.  HENT:  Head: Normocephalic and atraumatic.  Eyes: EOM are normal.  Neck: Neck supple.  Cardiovascular: Normal rate, regular rhythm and normal heart sounds.   Pulmonary/Chest: Effort normal.  Breath sounds diminished bilaterally  Abdominal: Soft. Bowel sounds are normal.  Musculoskeletal: Normal range of motion.  Neurological: She is alert and oriented to person, place, and time.  Skin: Skin is warm and dry.  Psychiatric: She has a normal mood and affect. Her behavior is normal. Judgment and thought content normal.  Vitals reviewed.   Mallampati Score:  MD Evaluation Airway: WNL Heart: WNL Abdomen: WNL Chest/ Lungs: Other (comments) Chest/ lungs comments: Diminished breath sounds = new Dx pneumonia ASA  Classification: 3 Mallampati/Airway Score: One  Imaging: Dg Chest 2 View  09/04/2015  CLINICAL DATA:  Leukocytosis. Severe vasogenic edema in the left cerebral hemisphere. EXAM: CHEST  2 VIEW COMPARISON:  09/18/2007 FINDINGS: Scattered pulmonary nodules are present. A mass at the right lung base measures 3.1 cm in diameter. Appearance favoring pulmonary metastatic disease bilaterally. Atherosclerotic aortic arch. IMPRESSION: 1. Numerous scattered bilateral pulmonary nodules and a right basilar pulmonary mass. Appearance favors  metastatic disease to both lungs, especially in light of the vasogenic edema in the brain. 2. Atherosclerotic aortic arch. Electronically Signed   By: Van Clines M.D.   On: 09/04/2015 15:22   Ct Head Wo Contrast  09/04/2015  CLINICAL DATA:  Frontal headache and weakness all over for several weeks. EXAM: CT HEAD WITHOUT CONTRAST TECHNIQUE: Contiguous axial images were obtained from the base of the skull through the vertex without intravenous contrast. COMPARISON:  04/02/2006 FINDINGS: Left vertebral artery atherosclerotic calcification. Prominent sulcal effacement in the left cerebral hemisphere with extensive white matter hypodensity favoring vasogenic edema primarily in the left temporal, occipital, and parietal lobes. Effacement of the left lateral ventricle 7 mm of left-to-right midline shift. I do not observe definite associated hematoma nor is there an obvious abnormality in the splenium of the corpus callosum or tracking cross to the contralateral side. There is some mild crowding along the ambient cisterns. The white  matter hypodensity tracks into the posterior limb of the left internal capsule and posteriorly into the left external capsule. No definite abnormal extra-axial fluid collection is identified. No overt trapping of the right lateral ventricle at this time. IMPRESSION: 1. Severe vasogenic edema in the left temporal, parietal, and occipital lobes associated with sulcal effacement in the left cerebral hemisphere, 7 mm of left-to-right midline shift, and some effacement of the ambient cistern and left lateral ventricle. This is a nonspecific but severe appearance which could reflect underlying tumor, encephalitis, or other severe inflammatory process. Pattern less likely to be due to stroke. When feasible, brain MRI with and without contrast is recommended for further assessment and characterization, and urgent neurologist consultation is recommended. Critical Value/emergent results were  called by telephone at the time of interpretation on 09/04/2015 at 12:32 pm to Dr. Julianne Rice, who verbally acknowledged these results. Electronically Signed   By: Van Clines M.D.   On: 09/04/2015 12:34   Ct Chest W Contrast  09/05/2015  CLINICAL DATA:  Patient with history of metastatic melanoma. Recently diagnosed with brain metastasis. EXAM: CT CHEST, ABDOMEN, AND PELVIS WITH CONTRAST TECHNIQUE: Multidetector CT imaging of the chest, abdomen and pelvis was performed following the standard protocol during bolus administration of intravenous contrast. CONTRAST:  191mL OMNIPAQUE IOHEXOL 300 MG/ML  SOLN COMPARISON:  None. FINDINGS: CT CHEST FINDINGS Mediastinum/Lymph Nodes: Visualized thyroid is unremarkable. No axillary lymphadenopathy. Normal heart size. No pericardial effusion. There is a 1.4 cm precarinal lymph node (image 22; series 2). Lungs/Pleura: Central airways are patent. Innumerable pulmonary nodules are demonstrated throughout the lungs bilaterally. Reference left lower lobe nodule measures 1.7 cm (image 46; series 3). Reference right middle lobe nodule measures 2.6 cm (image 42; series 3). No pleural effusion or pneumothorax. Musculoskeletal: No aggressive or acute appearing osseous lesions. CT ABDOMEN PELVIS FINDINGS Hepatobiliary: Liver is normal in size and contour. No definite focal lesion identified. Within the gallbladder lumen there is a 2.1 cm soft tissue mass. No gallbladder wall thickening or pericholecystic fluid. Pancreas: Unremarkable Spleen: Unremarkable Adrenals/Urinary Tract: Adrenal glands are normal. Malrotation of the left kidney. Too small to characterize low-attenuation lesions within the left and right kidneys. No hydronephrosis. Urinary bladder is unremarkable. Stomach/Bowel: Sigmoid colonic diverticulosis. No CT evidence for acute diverticulitis. No free fluid or free intraperitoneal air. Vascular/Lymphatic: Tortuous yet normal caliber abdominal aorta. Peripheral  calcified atherosclerotic plaque. No retroperitoneal lymphadenopathy. Other: Uterus is surgically absent. Small benign-appearing left adnexal cystic lesion. Musculoskeletal: Patchy lucencies within the superior aspect of the right and left ilium. Rightward curvature of the lumbar spine with associated degenerative disease. There is a 6.1 x 4.8 cm enhancing soft tissue mass within the right paraspinous musculature (image 76; series 2). IMPRESSION: Findings compatible with metastatic disease including innumerable bilateral pulmonary nodules, mediastinal adenopathy and right paraspinous soft tissue mass. Nonspecific lucencies within the right and left ilium may represent metastatic disease. Soft tissue mass within the gallbladder lumen may represent stone or potentially metastatic disease. Electronically Signed   By: Lovey Newcomer M.D.   On: 09/05/2015 13:03   Mr Jeri Cos [id number] Contrast  09/04/2015  CLINICAL DATA:  Slowly progressive visual loss, difficulty with cognition, and RIGHT-sided weakness. History of melanoma resection LEFT neck 2 years ago. EXAM: MRI HEAD WITHOUT AND WITH CONTRAST TECHNIQUE: Multiplanar, multiecho pulse sequences of the brain and surrounding structures were obtained without and with intravenous contrast. CONTRAST:  57mL MULTIHANCE GADOBENATE DIMEGLUMINE 529 MG/ML IV SOLN COMPARISON:  CT head earlier today. FINDINGS:  No acute stroke, hydrocephalus, or extra-axial fluid. As predicted from CT, there is an intracranial mass contributing to vasogenic edema. The mass is solitary, located in the LEFT occipital lobe, superficial, but intra-axial. There is T1 hyperintensity precontrast with avid enhancement postcontrast. The lesion measures 33 x 31 x 29 mm, and does share an inferior border with the LEFT transverse sinus. Overall the mass is hypointense on T2 weighted imaging. Gradient sequence does not demonstrate acute hemorrhage, but a few prominent vessels are noted. Early uncal herniation is noted,  with LEFT-to-RIGHT shift of 7 mm at the anterior third ventricle level. Normal for age cerebral volume. Minor white matter disease. No hydrocephalus or ventricular trapping. No tonsillar herniation. Extracranial soft tissues unremarkable. IMPRESSION: Solitary, greater than 3 cm, LEFT occipital intra-axial metastasis, demonstrating precontrast T1 shortening consistent with metastatic melanoma. Avid postcontrast enhancement and prominent intratumoral vessels suggest a highly vascular lesion. Extensive vasogenic edema with early LEFT uncal herniation and 7 mm LEFT-to-RIGHT shift. Electronically Signed   By: Staci Righter M.D.   On: 09/04/2015 19:10   Ct Abdomen Pelvis W Contrast  09/05/2015  CLINICAL DATA:  Patient with history of metastatic melanoma. Recently diagnosed with brain metastasis. EXAM: CT CHEST, ABDOMEN, AND PELVIS WITH CONTRAST TECHNIQUE: Multidetector CT imaging of the chest, abdomen and pelvis was performed following the standard protocol during bolus administration of intravenous contrast. CONTRAST:  175mL OMNIPAQUE IOHEXOL 300 MG/ML  SOLN COMPARISON:  None. FINDINGS: CT CHEST FINDINGS Mediastinum/Lymph Nodes: Visualized thyroid is unremarkable. No axillary lymphadenopathy. Normal heart size. No pericardial effusion. There is a 1.4 cm precarinal lymph node (image 22; series 2). Lungs/Pleura: Central airways are patent. Innumerable pulmonary nodules are demonstrated throughout the lungs bilaterally. Reference left lower lobe nodule measures 1.7 cm (image 46; series 3). Reference right middle lobe nodule measures 2.6 cm (image 42; series 3). No pleural effusion or pneumothorax. Musculoskeletal: No aggressive or acute appearing osseous lesions. CT ABDOMEN PELVIS FINDINGS Hepatobiliary: Liver is normal in size and contour. No definite focal lesion identified. Within the gallbladder lumen there is a 2.1 cm soft tissue mass. No gallbladder wall thickening or pericholecystic fluid. Pancreas: Unremarkable  Spleen: Unremarkable Adrenals/Urinary Tract: Adrenal glands are normal. Malrotation of the left kidney. Too small to characterize low-attenuation lesions within the left and right kidneys. No hydronephrosis. Urinary bladder is unremarkable. Stomach/Bowel: Sigmoid colonic diverticulosis. No CT evidence for acute diverticulitis. No free fluid or free intraperitoneal air. Vascular/Lymphatic: Tortuous yet normal caliber abdominal aorta. Peripheral calcified atherosclerotic plaque. No retroperitoneal lymphadenopathy. Other: Uterus is surgically absent. Small benign-appearing left adnexal cystic lesion. Musculoskeletal: Patchy lucencies within the superior aspect of the right and left ilium. Rightward curvature of the lumbar spine with associated degenerative disease. There is a 6.1 x 4.8 cm enhancing soft tissue mass within the right paraspinous musculature (image 76; series 2). IMPRESSION: Findings compatible with metastatic disease including innumerable bilateral pulmonary nodules, mediastinal adenopathy and right paraspinous soft tissue mass. Nonspecific lucencies within the right and left ilium may represent metastatic disease. Soft tissue mass within the gallbladder lumen may represent stone or potentially metastatic disease. Electronically Signed   By: Lovey Newcomer M.D.   On: 09/05/2015 13:03   Dg Chest Port 1 View  09/24/2015  CLINICAL DATA:  82 year old female with chest pain. EXAM: PORTABLE CHEST 1 VIEW COMPARISON:  Chest CT dated 09/05/2015 FINDINGS: Single-view of the chest demonstrate mild diffuse airspace haziness in the left lung predominantly involving the left lung base concerning for pneumonia. Multiple bilateral pulmonary nodules as  seen on the prior study and most compatible with metastatic disease. There is no pleural effusion or pneumothorax. The cardiac silhouette is within normal limits. No acute osseous pathology identified. IMPRESSION: Diffuse airspace haziness in the left lung most concerning  for developing pneumonia. Multiple bilateral pulmonary nodules compatible with metastatic disease Electronically Signed   By: Anner Crete M.D.   On: 09/24/2015 00:19    Labs:  CBC:  Recent Labs  09/05/15 0518 09/06/15 0952 09/07/15 0727 09/09/2015 2355  WBC 20.8* 30.1* 29.7* 51.0*  HGB 14.3 14.6 14.9 14.5  HCT 42.8 44.5 45.0 42.8  PLT 202 247 239 138*    COAGS:  Recent Labs  09/04/15 1140 09/24/15 0833  INR 1.11 1.27  APTT 30 26    BMP:  Recent Labs  09/05/15 0518 09/06/15 0952 09/07/15 0727 09/22/2015 2355  NA 129* 130* 130* 124*  K 3.6 3.7 3.8 3.9  CL 96* 92* 91* 85*  CO2 24 27 28 29   GLUCOSE 131* 173* 135* 90  BUN 7 13 13 11   CALCIUM 8.9 9.4 9.3 8.2*  CREATININE 0.58 0.71 0.73 0.56  GFRNONAA >60 >60 >60 >60  GFRAA >60 >60 >60 >60    LIVER FUNCTION TESTS:  Recent Labs  02/18/15 0848 07/02/15 0817 09/04/15 1140 09/06/2015 2355  BILITOT 0.9 0.8 0.8 1.3*  AST 16 19 19 18   ALT 9 10 12* 18  ALKPHOS 70 64 60 53  PROT 7.6 7.4 6.9 5.0*  ALBUMIN 4.2 4.1 3.6 2.4*    TUMOR MARKERS: No results for input(s): AFPTM, CEA, CA199, CHROMGRNA in the last 8760 hours.  Assessment and Plan:  Paraspinal mass = Patient would like to proceed with biopsy, in fact she had hoped we would proceed with it today.  Pneumonia = Given this change in her status, we will hold off on biopsy today and re-evaluate her again tomorrow.   Leukocytosis = Decadron in med list. Could be combination of PNA and steriod use.   We may need to hold off until pneumonia is resolved since she will need to be sedated for procedure.   Will follow.  Will make her NPO after MN tonight and hold any anticoagulation in case her status has improved and we can proceed.  Thank you for this interesting consult.  I greatly enjoyed meeting Kalista KAILYE AREBALO and look forward to participating in their care.  A copy of this report was sent to the requesting provider on this date.  Electronically  Signed: Murrell Redden PA-C 09/24/2015, 12:00 PM   I spent a total of 40 Minutes in face to face in clinical consultation, greater than 50% of which was counseling/coordinating care for image guided bx of paraspinal mass.

## 2015-09-24 NOTE — ED Notes (Signed)
Attempted report 

## 2015-09-24 NOTE — Progress Notes (Signed)
Susan Clark [id number] Admission Data: 09/24/2015 02:55 PM Attending Provider: Samuella Cota, MD  [id number], MD Consults/ Treatment Team:    Susan Clark is a 82 y.o. female patient admitted from ED awake, alert  & orientated  X 3,  DNR, VSS - Blood pressure 129/58, pulse 51, temperature 97.8 F (36.6 C), temperature source Oral, resp. rate 19, SpO2 95 %., Room air, no c/o shortness of breath, no c/o chest pain, no distress noted.   IV site WDL:  Left AC with a transparent dsg that's clean dry and intact, with NS at 33mL/h  Allergies:   Allergies  Allergen Reactions  . Buprenorphine Hcl Other (See Comments)    Make her see things that are not there and makes her nausea   . Morphine And Related     Make her see things that are not there and makes her nausea   . Prednisone Other (See Comments)    Made her hyper and could not sleep      Past Medical History  Diagnosis Date  . Palpitations     occasional  . Chronic back pain   . Hypertension    Pt orientation to unit, room and routine. Information packet given to patient/family and safety video watched.  Admission INP armband ID verified with patient/family, and in place. SR up x 2, fall risk assessment complete with Patient and family verbalizing understanding of risks associated with falls. Pt verbalizes an understanding of how to use the call bell and to call for help before getting out of bed.  Skin, clean-dry- intact without evidence of bruising, or skin tears.   No evidence of skin break down noted on exam. Multiple scattered bruises noted to Arms, legs, left and right foot, buttocks. Skin exam done with [name] RN  Will cont to monitor and assist as needed.  Dorita Fray, RN 09/24/2015 4:46 PM

## 2015-09-24 NOTE — ED Notes (Signed)
Admitting provider paged regarding 11am needle biopsy in IR. RN notified provider that pt. Did have a meal + drink at around 7am.

## 2015-09-24 NOTE — H&P (Signed)
History and Physical  Patient Name: Susan Clark     [id number]    DOB: 03-18-1929    DOA: 09/14/2015 Referring physician: Rolland Porter, MD PCP: Stephens Shire, MD      Chief Complaint: Chest pain and lethargy  HPI: Susan Clark is a 82 y.o. female with a past medical history significant for CAD and metastatic melanoma who presents with chest pain and cough and weakness.  The patient has a history several years ago of cutaneous melanoma, and earlier this month was diagnosed with new brain mass (she presented to [facility] with focal weakness and ataxia and visual problems), as well as pulmonary nodules and a paraspinal mass.  This latter was scheduled to be biopsied today; although metastatic melanoma was favored, metastasis from a different primary was also considered.  Since discharge from the hospital, the patient has been feeling weaker and weaker. Over the last several days she has been somewhat lethargic she can't get up out of a chair without assistance. Several days ago she developed cough, and today she developed a sharp chest pain, intermittent, severe, worse with movement, and so she came to the ER.  In the ED, she was afebrile and hemodynamically stable. A chest x-ray showed patchy opacities in the left lung, new from 2 weeks ago.  Na 124, K 3.9, Cr 0.6, TBili 1.3, WBC 51K, lactate normal and Hgb 14.  Vancomycin and cefepime were ordered for HCAP and TRH were asked to evaluate for admission.     Review of Systems:  Endorses lethargy, fatigue, chest pain, cough, blood on the toilet tissue.  Denies fever, chills, sputum, urinary symptoms, diarrhea, abdominal pain. All other systems negative except as just noted or noted in the history of present illness.  Allergies  Allergen Reactions  . Buprenorphine Hcl Other (See Comments)    Make her see things that are not there and makes her nausea   . Morphine And Related     Make her see things that are not there and makes her nausea    . Prednisone Other (See Comments)    Made her hyper and could not sleep     Prior to Admission medications   Medication Sig Start Date End Date Taking? Authorizing Provider  amLODipine (NORVASC) 5 MG tablet Take 1 tablet (5 mg total) by mouth daily. 10/30/14  Yes Darlin Coco, MD  aspirin EC 325 MG tablet Take 1 tablet (325 mg total) by mouth daily. 09/08/15  Yes Bonnielee Haff, MD  dexamethasone (DECADRON) 4 MG tablet Take 1 tablet (4 mg total) by mouth every 6 (six) hours. 09/08/15  Yes Bonnielee Haff, MD  nitroGLYCERIN (NITROSTAT) 0.4 MG SL tablet Place 1 tablet (0.4 mg total) under the tongue every 5 (five) minutes as needed for chest pain. 09/05/13  Yes Darlin Coco, MD  Probiotic Product (PROBIOTIC DAILY PO) Take 1 tablet by mouth daily.    Yes Historical Provider, MD  propranolol (INDERAL) 40 MG tablet Take 1 tablet (40 mg total) by mouth 2 (two) times daily. 08/18/15  Yes Darlin Coco, MD  simvastatin (ZOCOR) 20 MG tablet Take 1 tablet (20 mg total) by mouth at bedtime. 10/30/14  Yes Darlin Coco, MD  zolpidem (AMBIEN) 10 MG tablet Take 1 tablet (10 mg total) by mouth at bedtime as needed for sleep. 07/31/15  Yes Darlin Coco, MD    Past Medical History  Diagnosis Date  . Palpitations     occasional  . Chronic back pain   .  Hypertension     Past Surgical History  Procedure Laterality Date  . Tonsillectomy and adenoidectomy    . Partial hysterectomy  1971  . Anterior cervical discectomy  03/28/2006    C4-C5  . Cardiovascular stress test  09/13/2007    EF 74%, normal study  . Coronary angioplasty with stent placement  2004    UNKNOWN WHICH TYPE    Family history: family history includes Arthritis in her father; Bladder Cancer in her brother; Bone cancer in her brother; Cancer in her mother and sister; Colon cancer in her sister; Emphysema in her brother; Heart attack in her father; Heart disease in her father; Hypertension in her mother.  Social History: Patient  lives by herself. Her son lives next door. She is from [place] originally. She has 4 children. She worked in an office. She does not smoke.  She used a walker before this week, but cannot walk now.     Physical Exam: BP 115/49 mmHg  Pulse 56  Temp(Src) 98.7 F (37.1 C) (Oral)  Resp 19  SpO2 94% General appearance: Frail elderly female, alert but tired.  Eyes: Anicteric, conjunctiva pink, lids and lashes normal.     ENT: No nasal deformity, discharge, or epistaxis.  OP moist without lesions.   Lymph: No cervical or supraclavicular lymphadenopathy. Skin: Warm and dry.  Pale. Cardiac: RRR, nl S1-S2, no murmurs appreciated.  Capillary refill is brisk.  No LE edema.  Radial and DP pulses 2+ and symmetric. Respiratory: Normal respiratory rate and rhythm.  Rales on left, no wheezes. Abdomen: Abdomen soft without rigidity.  No TTP. No ascites, distension.   MSK: No deformities or effusions. Neuro: Tired.  Sensorium intact and responding to questions, attention normal.  Speech is fluent.  Moves all extremities equally and with normal coordination.    Psych: Behavior appropriate.  Affect sad.  No evidence of aural or visual hallucinations or delusions.       Labs on Admission:  The metabolic panel shows hyponatremia, normal renal function. Total bilirubin minimally elevated. Troponin normal. The complete blood count shows leukemoid reaction, no anemia or thrombocytopenia.   Radiological Exams on Admission: Personally reviewed: Dg Chest Port 1 View  09/24/2015  CLINICAL DATA:  82 year old female with chest pain. EXAM: PORTABLE CHEST 1 VIEW COMPARISON:  Chest CT dated 09/05/2015 FINDINGS: Single-view of the chest demonstrate mild diffuse airspace haziness in the left lung predominantly involving the left lung base concerning for pneumonia. Multiple bilateral pulmonary nodules as seen on the prior study and most compatible with metastatic disease. There is no pleural effusion or  pneumothorax. The cardiac silhouette is within normal limits. No acute osseous pathology identified. IMPRESSION: Diffuse airspace haziness in the left lung most concerning for developing pneumonia. Multiple bilateral pulmonary nodules compatible with metastatic disease Electronically Signed   By: Anner Crete M.D.   On: 09/24/2015 00:19    EKG: Independently reviewed. Complete 60, QTC 434, LVH with early repolarization.    Assessment/Plan 1. Chest pain and cough:  This is new.  This appears to be a new left-sided opacity on chest x-ray with increased WBC and cough.    -Continue vancomycin and cefepime for pneumonia -Cautious IV fluids overnight   2. Brain, lung, paraspinal metastasis:  This is new.  Suspected melanoma versus unknown primary. Biopsy scheduled for today, deferred. -A message was sent to the patient's oncologist as an [place] that the patient was here -Palliative care was discussed with the family; they would like to see how  the patient responds to treatment for pneumonia, but it appears that she is having an overall decline, fairly rapidly, and contingent plan for palliative care consult should be considered if she does not improve to the point of being able to obtain biopsy -Continue dexamethasone, ondansetron  3. Hyponatremia:  Worse than previous.  Presumably hypovolemic.  Possibly SIADH given brain metastasis -Check free water clearance -Cautious fluids  4. Leukocytosis:  Dexamethasone superimposed on suspected PNA. -Trend CBC   DVT PPx: SCDs Diet: Regular Consultants: None Code Status: DO NOT RESUSCITATE Family Communication: Son, present at bedside.  Overnight plan discussed, and plans to contact Dr. Alen Blew discussed.  Palliative care discussed, family and patient appear to have clear understanding of incurable nature of disease, possibility of "hormone boost" medicine to slow progression for a time, upcoming need to stop dexamethasone and concerns about  consequences after that.  CODE STATUS confirmed. Medical decision making: What exists of the patient's previous chart was reviewed in depth and the case was discussed with Dr. Tomi Bamberger. Patient seen 5:11 AM on 09/24/2015.  Disposition Plan:  I recommend admission to medical surgical bed, inpatient status.  Clinical condition: frail and debilitated, near EOL.  Anticipate treatment for pneumonia.  If functional status without improvement, consider palliative consult.        Edwin Dada [facility] Pager [phone]

## 2015-09-24 NOTE — Progress Notes (Signed)
Patient ID: Susan Clark, female   DOB: 04-06-1929, 82 y.o.   MRN: [id number]   Pt was scheduled as OP today for paraspinal mass biopsy Must have In patient order if team wants to move forward while pt is admitted  Please order bx in computer if want to have bx as in pt

## 2015-09-24 NOTE — Progress Notes (Signed)
Pharmacy Antibiotic Note  Susan Clark is a 82 y.o. female admitted on 09/25/2015 with pneumonia.  Pharmacy has been consulted for vancomycin dosing.  Plan: Vancomycin 500 IV every 12 hours.  Goal trough 15-20 mcg/mL.  Temp (24hrs), Avg:98.7 F (37.1 C), Min:98.7 F (37.1 C), Max:98.7 F (37.1 C)   Recent Labs Lab 08/27/2015 2355  WBC 51.0*  CREATININE 0.56    Estimated Creatinine Clearance: 47.1 mL/min (by C-G formula based on Cr of 0.56).    Allergies  Allergen Reactions  . Buprenorphine Hcl Other (See Comments)    Make her see things that are not there and makes her nausea   . Morphine And Related     Make her see things that are not there and makes her nausea   . Prednisone Other (See Comments)    Made her hyper and could not sleep      Thank you for allowing pharmacy to be a part of this patient's care.  Wynona Neat, PharmD, BCPS  09/24/2015 5:13 AM

## 2015-09-25 ENCOUNTER — Ambulatory Visit: Payer: PPO | Admitting: Cardiology

## 2015-09-25 ENCOUNTER — Ambulatory Visit (HOSPITAL_COMMUNITY): Payer: PPO

## 2015-09-25 DIAGNOSIS — I4891 Unspecified atrial fibrillation: Secondary | ICD-10-CM

## 2015-09-25 LAB — CBC
HEMATOCRIT: 41.8 % (ref 36.0–46.0)
HEMOGLOBIN: 14.2 g/dL (ref 12.0–15.0)
MCH: 24.9 pg — ABNORMAL LOW (ref 26.0–34.0)
MCHC: 34 g/dL (ref 30.0–36.0)
MCV: 73.2 fL — ABNORMAL LOW (ref 78.0–100.0)
Platelets: 124 10*3/uL — ABNORMAL LOW (ref 150–400)
RBC: 5.71 MIL/uL — ABNORMAL HIGH (ref 3.87–5.11)
RDW: 15.8 % — ABNORMAL HIGH (ref 11.5–15.5)
WBC: 38 10*3/uL — AB (ref 4.0–10.5)

## 2015-09-25 LAB — BASIC METABOLIC PANEL
ANION GAP: 8 (ref 5–15)
BUN: 8 mg/dL (ref 6–20)
CO2: 31 mmol/L (ref 22–32)
Calcium: 8 mg/dL — ABNORMAL LOW (ref 8.9–10.3)
Chloride: 86 mmol/L — ABNORMAL LOW (ref 101–111)
Creatinine, Ser: 0.42 mg/dL — ABNORMAL LOW (ref 0.44–1.00)
GFR calc Af Amer: >60 mL/min (ref >60)
Glucose, Bld: 111 mg/dL — ABNORMAL HIGH (ref 65–99)
POTASSIUM: 3.1 mmol/L — AB (ref 3.5–5.1)
SODIUM: 125 mmol/L — AB (ref 135–145)

## 2015-09-25 MED ORDER — RISAQUAD PO CAPS
1.0000 | ORAL_CAPSULE | Freq: Every day | ORAL | Status: DC
  Administered 2015-09-26 – 2015-09-29 (×4): 1 via ORAL
  Filled 2015-09-25 (×5): qty 1

## 2015-09-25 MED ORDER — MIDAZOLAM HCL 2 MG/2ML IJ SOLN
INTRAMUSCULAR | Status: AC
  Filled 2015-09-25: qty 4

## 2015-09-25 MED ORDER — MIDAZOLAM HCL 2 MG/2ML IJ SOLN
INTRAMUSCULAR | Status: AC | PRN
  Administered 2015-09-25: 0.5 mg via INTRAVENOUS

## 2015-09-25 MED ORDER — FENTANYL CITRATE (PF) 100 MCG/2ML IJ SOLN
INTRAMUSCULAR | Status: AC
  Filled 2015-09-25: qty 4

## 2015-09-25 MED ORDER — SODIUM CHLORIDE 0.9 % IV SOLN
INTRAVENOUS | Status: DC
  Administered 2015-09-25 – 2015-09-26 (×3): via INTRAVENOUS

## 2015-09-25 MED ORDER — LIDOCAINE HCL 1 % IJ SOLN
INTRAMUSCULAR | Status: AC
  Filled 2015-09-25: qty 20

## 2015-09-25 MED ORDER — SACCHAROMYCES BOULARDII 250 MG PO CAPS
250.0000 mg | ORAL_CAPSULE | Freq: Every day | ORAL | Status: DC
  Administered 2015-09-25: 250 mg via ORAL
  Filled 2015-09-25: qty 1

## 2015-09-25 MED ORDER — FENTANYL CITRATE (PF) 100 MCG/2ML IJ SOLN
INTRAMUSCULAR | Status: AC | PRN
  Administered 2015-09-25: 12.5 ug via INTRAVENOUS

## 2015-09-25 MED ORDER — PROPRANOLOL HCL 40 MG PO TABS
40.0000 mg | ORAL_TABLET | Freq: Two times a day (BID) | ORAL | Status: DC
  Administered 2015-09-25 – 2015-09-27 (×4): 40 mg via ORAL
  Filled 2015-09-25 (×6): qty 1

## 2015-09-25 MED ORDER — SIMVASTATIN 20 MG PO TABS
20.0000 mg | ORAL_TABLET | Freq: Every day | ORAL | Status: DC
  Administered 2015-09-25 – 2015-09-28 (×4): 20 mg via ORAL
  Filled 2015-09-25 (×4): qty 1

## 2015-09-25 NOTE — Progress Notes (Signed)
Pt has been in CT for paraspinal bx- pt noted to be in AFIB rate 105-129. BP stable. RN on 5W made aware. Report given about sedation and procedure.

## 2015-09-25 NOTE — Sedation Documentation (Signed)
dsg to low back intact, dry

## 2015-09-25 NOTE — Sedation Documentation (Signed)
Patient is resting comfortably. 

## 2015-09-25 NOTE — Progress Notes (Signed)
PROGRESS NOTE  Susan Clark [id number] DOB: Aug 31, 1928 DOA: 09/22/2015 PCP: Stephens Shire, MD  Summary: 82 year old woman with metastatic disease of unclear primary at this point he presented with left-sided chest pain, cough and weakness. Chest x-ray suggested developing pneumonia and she was admitted for further treatment.  Assessment/Plan: 1. Pneumonia , clinically improving, afebrile, no hypoxia. WBC trending down. No features to suggest sepsis or severe infection. MRSA PCR was negative, vancomycin discontinued. 2. Atrial fibrillation with rapid ventricular response. Patient reports a history of tachycardia by do not see any history of atrial fibrillation. She is normally on propranolol at home and this has not yet been restarted. Suspect related to acute pulmonary issues. She is asymptomatic at this point. 3. Metastatic malignancy with brain metastasis with vasogenic edema, as well as pulmonary paraspinal metastases. CNS metastasis was treated with radiation therapy 3/22. She continues on dexamethasone 4 times daily. Status post paraspinal biopsy today. 4. Chronic hyponatremia. Secondary to malignancy. Asymptomatic. No significant change. Suspect a component of hypovolemia but do not expect correction to normal.   Overall she appears weak but stable.  Continue IV antibiotics, repeat basic metabolic panel and CBC in the morning.  Check TSH. Not a candidate for anticoagulation today given paraspinal biopsy today, this could be considered in the near future. Hold off on echocardiogram for now as I don't think this would change management.  Code Status: DNR DVT prophylaxis: SCDs Family Communication: Husband and daughter bedside Disposition Plan: home 1-2 days  Murray Hodgkins, MD  [facility] Direct contact:  --[place] [place]  --[email or web address]; password [email or web address] and click  [email or web address] contact night coverage as above 09/25/2015, 4:08 PM  LOS: 1 day    Consultants:    Procedures:    Antibiotics:   Cefepime 3/29 >>  Vancomycin 3/29 >>  HPI/Subjective: Overall feels better today. No left-sided chest pain. Breathing okay. Feels generally weak. Biopsy was uncomplicated but did develop atrial fibrillation with rapid ventricular response which is apparently new diagnosis. Daughter bedside reports the patient has noted a vaginal mass that has been present for several years.  Objective: Filed Vitals:   09/25/15 1223 09/25/15 1227 09/25/15 1230 09/25/15 1431  BP: 140/81 130/80 129/81 124/60  Pulse: 128 127 130 57  Temp:    98.1 F (36.7 C)  TempSrc:    Oral  Resp: 16 16 17 19   SpO2: 98% 98% 96% 94%    Exam:    General:  Appears comfortable, calm. Cardiovascular: Irregular, tachycardic, no murmur rub or gallop. Telemetry: Atrial fibrillation with rapid ventricular response. Ventricular rate 100. Respiratory: Clear to auscultation bilaterally, no wheezes, rales or rhonchi. Normal respiratory effort. GYN: Examined with RN at bedside [facility]. There is a small mass lateral and inferior to the left labia majora. It is nontender, soft and mobile. Overlying skin appears unremarkable. Psychiatric: grossly normal mood and affect, speech fluent and appropriate  New data reviewed:  Sodium 125, potassium 3.1, chloride 86  WBC 51 >> 38  Platelets 124  Scheduled Meds: . [START ON 09/26/2015] acidophilus  1 capsule Oral Daily  . aspirin EC  325 mg Oral Daily  . ceFEPime (MAXIPIME) IV  2 g Intravenous Q24H  . dexamethasone  4 mg Oral 4 times per day  . fentaNYL      . lidocaine      . midazolam      . propranolol  40 mg Oral BID  . simvastatin  20 mg Oral QHS   Continuous Infusions: . sodium  chloride 75 mL/hr at 09/24/15 2315    Principal Problem:   Healthcare-associated pneumonia Active Problems:   Metastatic melanoma of brain (HCC)   Hyponatremia   Leukocytosis   Atrial fibrillation ([place])   Time spent 20  minutes

## 2015-09-25 NOTE — Procedures (Signed)
CT guided core biopsies of right paraspinal mass.  4 cores obtained.  No immediate complication.  Minimal blood loss.

## 2015-09-26 DIAGNOSIS — I480 Paroxysmal atrial fibrillation: Secondary | ICD-10-CM

## 2015-09-26 DIAGNOSIS — E871 Hypo-osmolality and hyponatremia: Secondary | ICD-10-CM

## 2015-09-26 LAB — BASIC METABOLIC PANEL
ANION GAP: 8 (ref 5–15)
BUN: 8 mg/dL (ref 6–20)
CALCIUM: 7.8 mg/dL — AB (ref 8.9–10.3)
CO2: 26 mmol/L (ref 22–32)
Chloride: 89 mmol/L — ABNORMAL LOW (ref 101–111)
Creatinine, Ser: 0.35 mg/dL — ABNORMAL LOW (ref 0.44–1.00)
GFR calc Af Amer: >60 mL/min (ref >60)
GLUCOSE: 104 mg/dL — AB (ref 65–99)
Potassium: 3.3 mmol/L — ABNORMAL LOW (ref 3.5–5.1)
Sodium: 123 mmol/L — ABNORMAL LOW (ref 135–145)

## 2015-09-26 LAB — CBC
HCT: 40.4 % (ref 36.0–46.0)
Hemoglobin: 14 g/dL (ref 12.0–15.0)
MCH: 25.4 pg — ABNORMAL LOW (ref 26.0–34.0)
MCHC: 34.7 g/dL (ref 30.0–36.0)
MCV: 73.3 fL — AB (ref 78.0–100.0)
PLATELETS: 124 10*3/uL — AB (ref 150–400)
RBC: 5.51 MIL/uL — ABNORMAL HIGH (ref 3.87–5.11)
RDW: 15.9 % — AB (ref 11.5–15.5)
WBC: 34.3 10*3/uL — AB (ref 4.0–10.5)

## 2015-09-26 LAB — TSH: TSH: 0.398 u[IU]/mL (ref 0.350–4.500)

## 2015-09-26 MED ORDER — DIPHENHYDRAMINE HCL 25 MG PO CAPS
25.0000 mg | ORAL_CAPSULE | Freq: Once | ORAL | Status: AC
  Administered 2015-09-26: 25 mg via ORAL
  Filled 2015-09-26: qty 1

## 2015-09-26 MED ORDER — ZOLPIDEM TARTRATE 5 MG PO TABS
5.0000 mg | ORAL_TABLET | Freq: Every evening | ORAL | Status: DC | PRN
  Administered 2015-09-26 – 2015-09-30 (×4): 5 mg via ORAL
  Filled 2015-09-26 (×4): qty 1

## 2015-09-26 NOTE — Progress Notes (Signed)
  Radiation Oncology         [phone] ________________________________  Name: Susan Clark MRN: [id number]  Date: 09/17/2015  DOB: 04-Sep-1928  End of Treatment Note   ICD-9-CM ICD-10-CM    1. Metastatic melanoma of brain ([place]) 198.3 C79.31     DIAGNOSIS: 82 year-old woman with a 3.3 cm left occipital brain metastasis from stage IV metastatic melanoma     Indication for treatment:  Curative       Radiation treatment dates:   09/11/2015-09/17/2015  Site/dose/beams/energy:   Left occipital 3.3 cm target was treated using 4 VMAT beams to a fraction dose of 8 Gy to be repeated three times for a total prescription dose of 24 Gy. ExacTrac registration was performed for each couch angle. The 100% isodose line was prescribed. 6 MV X-rays were delivered in the flattening filter free beam mode.  Narrative: The patient tolerated radiation treatment relatively well with no acute complications with treatment.  Plan: The patient has completed radiation treatment. The patient will return to radiation oncology clinic for routine followup in one month. I advised her to call or return sooner if she has any questions or concerns related to her recovery or treatment. ________________________________  Sheral Apley. Tammi Klippel, M.D.  This document serves as a record of services personally performed by Tyler Pita, MD. It was created on his behalf by Arlyce Harman, a trained medical scribe. The creation of this record is based on the scribe's personal observations and the provider's statements to them. This document has been checked and approved by the attending provider.

## 2015-09-26 NOTE — Care Management Important Message (Signed)
Important Message  Patient Details  Name: Susan Clark MRN: [id number] Date of Birth: 01/17/1929   Medicare Important Message Given:  Yes    Zannie Locastro [name] 09/26/2015, 11:23 AM

## 2015-09-26 NOTE — Progress Notes (Signed)
  PROGRESS NOTE  Susan Clark [id number] DOB: 07-13-1928 DOA: 09/18/2015 PCP: Stephens Shire, MD  Summary: 82 year old woman with metastatic disease of unclear primary at this point he presented with left-sided chest pain, cough and weakness. Chest x-ray suggested developing pneumonia and she was admitted for further treatment.  Assessment/Plan: 1. Pneumonia , clinically improving, afebrile, no hypoxia. WBC trending down. No features to suggest sepsis or severe infection. MRSA PCR was negative, transition to oral ABX in AM 2. Atrial fibrillation with rapid ventricular response. Patient reports a history of tachycardia. Propranolol resumed.  3. Metastatic malignancy with brain metastasis with vasogenic edema, as well as pulmonary paraspinal metastases. CNS metastasis was treated with radiation therapy 3/22. She continues on dexamethasone 4 times daily. S/P paraspinal biopsy today. Dr. Alen Blew Notified.  4. Chronic hyponatremia. Secondary to malignancy. Asymptomatic. No significant change. 5. Hypokalemia - supplement and repeat BMP in AM.  6. Thrombocytopenia - mild, no signs of bleeding, CBC in AM  Code Status: DNR DVT prophylaxis: SCDs Family Communication: Husband and daughter bedside Disposition Plan: home 4/1  Faye Ramsay, MD  [facility] Pager [phone]  If 7PM-7AM, please contact night-coverage [email or web address] Password [email or web address]   09/26/2015, 6:40 PM  LOS: 2 days   Antibiotics:  Cefepime 3/29 >>  Vancomycin 3/29 >>  HPI/Subjective: Overall feels better today. No left-sided chest pain.   Objective: Filed Vitals:   09/25/15 2153 09/26/15 0451 09/26/15 1119 09/26/15 1342  BP: 134/56 146/49 159/83 167/70  Pulse: 60 62 69 58  Temp: 98 F (36.7 C) 98.2 F (36.8 C)  98 F (36.7 C)  TempSrc: Oral     Resp: 18 20  19   SpO2: 93% 91%  96%    Exam:    General:  Appears comfortable, calm. Cardiovascular: Irregular, tachycardic, no murmur rub or  gallop. Telemetry: Atrial fibrillation with rapid ventricular response. Ventricular rate 100. Respiratory: Clear to auscultation bilaterally, no wheezes, rales or rhonchi. Normal respiratory effort. Psychiatric: grossly normal mood and affect, speech fluent and appropriate   Scheduled Meds: . acidophilus  1 capsule Oral Daily  . aspirin EC  325 mg Oral Daily  . ceFEPime (MAXIPIME) IV  2 g Intravenous Q24H  . dexamethasone  4 mg Oral 4 times per day  . propranolol  40 mg Oral BID  . simvastatin  20 mg Oral QHS   Continuous Infusions: . sodium chloride 50 mL/hr at 09/26/15 1744    Principal Problem:   Healthcare-associated pneumonia Active Problems:   Metastatic melanoma of brain (HCC)   Hyponatremia   Leukocytosis   Atrial fibrillation ([place])   Time spent 20 minutes

## 2015-09-27 LAB — BASIC METABOLIC PANEL
Anion gap: 7 (ref 5–15)
BUN: 6 mg/dL (ref 6–20)
CHLORIDE: 86 mmol/L — AB (ref 101–111)
CO2: 31 mmol/L (ref 22–32)
CREATININE: 0.44 mg/dL (ref 0.44–1.00)
Calcium: 7.9 mg/dL — ABNORMAL LOW (ref 8.9–10.3)
GFR calc Af Amer: >60 mL/min (ref >60)
GFR calc non Af Amer: >60 mL/min (ref >60)
GLUCOSE: 111 mg/dL — AB (ref 65–99)
Potassium: 2.9 mmol/L — ABNORMAL LOW (ref 3.5–5.1)
SODIUM: 124 mmol/L — AB (ref 135–145)

## 2015-09-27 LAB — CBC
HCT: 41.3 % (ref 36.0–46.0)
HEMOGLOBIN: 14.5 g/dL (ref 12.0–15.0)
MCH: 25.6 pg — AB (ref 26.0–34.0)
MCHC: 35.1 g/dL (ref 30.0–36.0)
MCV: 72.8 fL — AB (ref 78.0–100.0)
Platelets: 110 10*3/uL — ABNORMAL LOW (ref 150–400)
RBC: 5.67 MIL/uL — ABNORMAL HIGH (ref 3.87–5.11)
RDW: 15.6 % — ABNORMAL HIGH (ref 11.5–15.5)
WBC: 33.4 10*3/uL — ABNORMAL HIGH (ref 4.0–10.5)

## 2015-09-27 MED ORDER — MAGIC MOUTHWASH W/LIDOCAINE
15.0000 mL | Freq: Four times a day (QID) | ORAL | Status: DC | PRN
  Administered 2015-09-27 – 2015-09-30 (×8): 15 mL via ORAL
  Filled 2015-09-27 (×12): qty 15

## 2015-09-27 MED ORDER — MAGIC MOUTHWASH: 10.0000 mL | Freq: Four times a day (QID) | ORAL | Status: DC | PRN

## 2015-09-27 MED ORDER — NITROGLYCERIN 0.4 MG SL SUBL: 0.4000 mg | SUBLINGUAL_TABLET | SUBLINGUAL | Status: DC | PRN

## 2015-09-27 MED ORDER — CHLORHEXIDINE GLUCONATE 0.12 % MT SOLN
15.0000 mL | Freq: Two times a day (BID) | OROMUCOSAL | Status: DC
  Administered 2015-09-27 – 2015-09-30 (×4): 15 mL via OROMUCOSAL
  Filled 2015-09-27 (×8): qty 15

## 2015-09-27 MED ORDER — PROPRANOLOL HCL 40 MG PO TABS
40.0000 mg | ORAL_TABLET | Freq: Every day | ORAL | Status: DC
  Administered 2015-09-28 – 2015-09-29 (×2): 40 mg via ORAL
  Filled 2015-09-27 (×6): qty 1

## 2015-09-27 MED ORDER — GI COCKTAIL ~~LOC~~: 30.0000 mL | Freq: Three times a day (TID) | ORAL | Status: DC | PRN

## 2015-09-27 MED ORDER — CETYLPYRIDINIUM CHLORIDE 0.05 % MT LIQD
7.0000 mL | Freq: Two times a day (BID) | OROMUCOSAL | Status: DC
  Administered 2015-09-27 – 2015-09-29 (×3): 7 mL via OROMUCOSAL

## 2015-09-27 NOTE — Progress Notes (Signed)
  PROGRESS NOTE  Susan Clark [id number] DOB: 1928-10-03 DOA: 09/08/2015 PCP: Stephens Shire, MD  Summary: 82 year old woman with metastatic disease of unclear primary at this point he presented with left-sided chest pain, cough and weakness. Chest x-ray suggested developing pneumonia and she was admitted for further treatment.  Assessment/Plan: 1. Pneumonia , unknown pathogen, clinically improving, afebrile, no hypoxia. WBC trending down. No features to suggest sepsis or severe infection. MRSA PCR was negative, transition to oral ABX in AM 2. Atrial fibrillation with rapid ventricular response. Patient reports a history of tachycardia. Propranolol resumed. Was brady overnight, will change Propranolol to 40 mg QD.  3. Metastatic malignancy with brain metastasis with vasogenic edema, as well as pulmonary paraspinal metastases. CNS metastasis was treated with radiation therapy 3/22. She continues on dexamethasone 4 times daily. S/P paraspinal biopsy today. Dr. Alen Blew Notified.  4. Chronic hyponatremia. Secondary to malignancy. Asymptomatic. No significant change. 5. Hypokalemia - still low, supplement and repeat BMP in AM.  6. Thrombocytopenia - mild, no signs of bleeding, CBC in AM  Code Status: DNR DVT prophylaxis: SCDs Family Communication: Husband and daughter bedside Disposition Plan: home 4/2  Faye Ramsay, MD  [facility] Pager [phone]  If 7PM-7AM, please contact night-coverage [email or web address] Password [email or web address]   09/27/2015, 4:50 PM  LOS: 3 days   Antibiotics:  Cefepime 3/29 >>  Vancomycin 3/29 >>  HPI/Subjective: Overall feels better today. No left-sided chest pain.   Objective: Filed Vitals:   09/26/15 1342 09/26/15 2104 09/27/15 0509 09/27/15 1408  BP: 167/70 149/58 157/79 142/53  Pulse: 58 55 62 54  Temp: 98 F (36.7 C) 97.7 F (36.5 C) 97.9 F (36.6 C) 97.8 F (36.6 C)  TempSrc:  Oral Oral Oral  Resp: 19 18 16 19   SpO2: 96% 95% 93% 95%     Exam:    General:  Appears comfortable, calm. Cardiovascular: Irregular, tachycardic, no murmur rub or gallop. Telemetry: Atrial fibrillation, HR in 50's.  Respiratory: Clear to auscultation bilaterally, no wheezes, rales or rhonchi. Normal respiratory effort. Psychiatric: grossly normal mood and affect, speech fluent and appropriate   Scheduled Meds: . acidophilus  1 capsule Oral Daily  . antiseptic oral rinse  7 mL Mouth Rinse q12n4p  . aspirin EC  325 mg Oral Daily  . ceFEPime (MAXIPIME) IV  2 g Intravenous Q24H  . chlorhexidine  15 mL Mouth Rinse BID  . dexamethasone  4 mg Oral 4 times per day  . [START ON 09/28/2015] propranolol  40 mg Oral Daily  . simvastatin  20 mg Oral QHS   Continuous Infusions:    Principal Problem:   Healthcare-associated pneumonia Active Problems:   Metastatic melanoma of brain (HCC)   Hyponatremia   Leukocytosis   Atrial fibrillation ([place])   Time spent 20 minutes

## 2015-09-27 NOTE — Evaluation (Signed)
Physical Therapy Evaluation Patient Details Name: Susan Clark MRN: [id number] DOB: 1929/01/08 Today's Date: 09/27/2015   History of Present Illness  82 y.o. female with a past medical history significant for CAD and metastatic melanoma (diagnosed 08/2015) who presents with chest pain and cough and weakness. She is admitted for HCAP.  Clinical Impression  Pt admitted with above diagnosis. Pt currently with functional limitations due to the deficits listed below (see PT Problem List). On eval, pt required min assist for all functional mobility. She ambulated 70 feet with RW. Distance was limited by fatigue. Pt will benefit from skilled PT to increase their independence and safety with mobility to allow discharge to the venue listed below.  Recommend wheelchair for community mobility. Pt has all other needed DME.     Follow Up Recommendations Home health PT;Supervision/Assistance - 24 hour    Equipment Recommendations  Wheelchair (measurements PT);Wheelchair cushion (measurements PT)    Recommendations for Other Services       Precautions / Restrictions Precautions Precautions: Fall      Mobility  Bed Mobility Overal bed mobility: Needs Assistance       Supine to sit: Min assist;HOB elevated     General bed mobility comments: +rail, assist to power up  Transfers Overall transfer level: Needs assistance Equipment used: Rolling walker (2 wheeled) Transfers: Sit to/from Stand Sit to Stand: Min assist Stand pivot transfers: Min assist       General transfer comment: verbal cues for hand placement  Ambulation/Gait Ambulation/Gait assistance: Min assist Ambulation Distance (Feet): 70 Feet Assistive device: Rolling walker (2 wheeled) Gait Pattern/deviations: Step-through pattern;Decreased stride length Gait velocity: decreased Gait velocity interpretation: Below normal speed for age/gender General Gait Details: fatigues quickly  Stairs            Wheelchair  Mobility    Modified Rankin (Stroke Patients Only)       Balance   Sitting-balance support: No upper extremity supported;Feet supported Sitting balance-Leahy Scale: Good     Standing balance support: Bilateral upper extremity supported;During functional activity Standing balance-Leahy Scale: Fair Standing balance comment: RW for ambulation                             Pertinent Vitals/Pain Pain Assessment: No/denies pain    Home Living Family/patient expects to be discharged to:: Private residence Living Arrangements: Spouse/significant other Available Help at Discharge: Family;Available 24 hours/day Type of Home: House Home Access: Stairs to enter Entrance Stairs-Rails: Psychiatric nurse of Steps: 3 Home Layout: One level;Other (Comment) (2 steps to sunken family room) Home Equipment: Walker - 2 wheels;Bedside commode;Grab bars - tub/shower Additional Comments: Pt lives with husband who works from home. Son also lives next door and works from home. Very supportive family.    Prior Function Level of Independence: Independent         Comments: Pt independent up until last 3 weeks when she started having progressive weakness.     Hand Dominance   Dominant Hand: Right    Extremity/Trunk Assessment   Upper Extremity Assessment: Generalized weakness           Lower Extremity Assessment: Generalized weakness         Communication   Communication: Expressive difficulties  Cognition Arousal/Alertness: Awake/alert Behavior During Therapy: WFL for tasks assessed/performed Overall Cognitive Status: Impaired/Different from baseline Area of Impairment: Memory;Safety/judgement;Problem solving     Memory: Decreased short-term memory   Safety/Judgement: Decreased awareness of deficits;Decreased  awareness of safety   Problem Solving: Slow processing;Difficulty sequencing General Comments: Pt with mild memory deficits starting a few  months ago.      General Comments      Exercises        Assessment/Plan    PT Assessment Patient needs continued PT services  PT Diagnosis Difficulty walking;Generalized weakness   PT Problem List Decreased strength;Decreased activity tolerance;Decreased balance;Decreased mobility;Decreased safety awareness;Decreased knowledge of use of DME  PT Treatment Interventions DME instruction;Gait training;Stair training;Functional mobility training;Therapeutic activities;Therapeutic exercise;Patient/family education;Cognitive remediation;Balance training   PT Goals (Current goals can be found in the Care Plan section) Acute Rehab PT Goals Patient Stated Goal: home PT Goal Formulation: With patient/family Time For Goal Achievement: 10/11/15 Potential to Achieve Goals: Good    Frequency Min 3X/week   Barriers to discharge Inaccessible home environment steps to enter home    Co-evaluation               End of Session Equipment Utilized During Treatment: Gait belt Activity Tolerance: Patient limited by fatigue Patient left: in chair;with family/visitor present;with call bell/phone within reach Nurse Communication: Mobility status         Time: [id number] PT Time Calculation (min) (ACUTE ONLY): 21 min   Charges:   PT Evaluation $PT Eval Moderate Complexity: 1 Procedure     PT G CodesLorriane Shire 09/27/2015, 3:58 PM

## 2015-09-27 DEATH — deceased

## 2015-09-28 ENCOUNTER — Encounter (HOSPITAL_COMMUNITY): Payer: Self-pay | Admitting: Radiology

## 2015-09-28 ENCOUNTER — Inpatient Hospital Stay (HOSPITAL_COMMUNITY): Payer: PPO

## 2015-09-28 DIAGNOSIS — R042 Hemoptysis: Secondary | ICD-10-CM

## 2015-09-28 DIAGNOSIS — C7931 Secondary malignant neoplasm of brain: Secondary | ICD-10-CM

## 2015-09-28 DIAGNOSIS — J189 Pneumonia, unspecified organism: Principal | ICD-10-CM

## 2015-09-28 LAB — CBC
HEMATOCRIT: 42.8 % (ref 36.0–46.0)
HEMOGLOBIN: 14.9 g/dL (ref 12.0–15.0)
MCH: 25.3 pg — ABNORMAL LOW (ref 26.0–34.0)
MCHC: 34.8 g/dL (ref 30.0–36.0)
MCV: 72.5 fL — AB (ref 78.0–100.0)
Platelets: 95 10*3/uL — ABNORMAL LOW (ref 150–400)
RBC: 5.9 MIL/uL — ABNORMAL HIGH (ref 3.87–5.11)
RDW: 15.4 % (ref 11.5–15.5)
WBC: 29.7 10*3/uL — AB (ref 4.0–10.5)

## 2015-09-28 LAB — BASIC METABOLIC PANEL
ANION GAP: 8 (ref 5–15)
BUN: 6 mg/dL (ref 6–20)
CHLORIDE: 83 mmol/L — AB (ref 101–111)
CO2: 31 mmol/L (ref 22–32)
Calcium: 7.9 mg/dL — ABNORMAL LOW (ref 8.9–10.3)
Creatinine, Ser: 0.35 mg/dL — ABNORMAL LOW (ref 0.44–1.00)
GFR calc Af Amer: >60 mL/min (ref >60)
GFR calc non Af Amer: >60 mL/min (ref >60)
GLUCOSE: 103 mg/dL — AB (ref 65–99)
Potassium: 2.9 mmol/L — ABNORMAL LOW (ref 3.5–5.1)
Sodium: 122 mmol/L — ABNORMAL LOW (ref 135–145)

## 2015-09-28 MED ORDER — POTASSIUM CHLORIDE CRYS ER 20 MEQ PO TBCR
40.0000 meq | EXTENDED_RELEASE_TABLET | Freq: Once | ORAL | Status: AC
  Administered 2015-09-28: 40 meq via ORAL
  Filled 2015-09-28: qty 2

## 2015-09-28 MED ORDER — ALBUTEROL SULFATE (2.5 MG/3ML) 0.083% IN NEBU: 2.5000 mg | INHALATION_SOLUTION | RESPIRATORY_TRACT | Status: DC | PRN

## 2015-09-28 MED ORDER — IOPAMIDOL (ISOVUE-370) INJECTION 76%
INTRAVENOUS | Status: AC
  Administered 2015-09-28: 100 mL
  Filled 2015-09-28: qty 100

## 2015-09-28 NOTE — Progress Notes (Signed)
RT called to give PRN albuterol for difficulty breathing. When rt arrived pt resting comfortably per family member pt was coughing a lot and was sat up and repositioned in bed and now feels better. BBS clear, RR 18. No distress noted. Albuterol not given at this time

## 2015-09-28 NOTE — Progress Notes (Addendum)
PROGRESS NOTE  Susan Clark [id number] DOB: [birth year] DOA: 09/15/2015 PCP: Stephens Shire, MD  Summary: 82 year old woman with metastatic disease of unclear primary at this point, presented with left-sided chest pain, cough and weakness. Chest x-ray suggested developing pneumonia and she was admitted for further treatment.  Major event since admission:  4/2 - sudden onset mid area chest discomfort and hemoptysis, PCCM consulted   Antibiotics:  Cefepime 3/29 >>  Vancomycin 3/29 >>  Assessment/Plan: 1. LLL PNA, treated as HCAP given recent hospitalization early March 2017  - started on vancomycin and cefepime for pneumonia, unknown pathogen - today is day #6 of Maxipime but vancomycin was stopped on 09/25/2015 - was initially doing well but this AM developed hemoptysis  - CT chest findings below, PCCM consult for assistance   2. Brain, lung, paraspinal metastasis:  - suspected melanoma versus unknown primary, was diagnosed with melanoma in 2011 and biopsy of the neck mass at that time c/w basal cell ca - apparently she was doing well for some time until early march developed weakness and was noted to have brain mets - Continue dexamethasone, ondansetron - pt has meeting with Dr. Alen Blew tomorrow and family coming from out of town to discuss further plan   3. Hyponatremia:  - Possibly SIADH given brain metastasis - off IVF, BMP in AM  4. Leukocytosis:  - Dexamethasone superimposed on suspected PNA. - CBC In AM  5. Thrombocytopenia  - in the setting of malignancy - SCD's for DVT prophylaxis   6. Hypokalemia - supplement and repeat BMP in AM  7. Protein calorie malnutrition - ? Severe, consulted dietician   8. Paroxysmal a-fib, CHADS2 score 3-4 - on propranolol for rate control - initially refused AC, but now given hemoptysis, would not think she is candidate for [facility] - focus on rate control    Diagnostic studies:  09/24/2015 CXR Diffuse airspace haziness  in the left lung most concerning for developing pneumonia. Multiple bilateral pulmonary nodules compatible with metastatic disease  Ct Angio Chest Pe W/cm &/or Wo Cm 09/28/2015 New dense masslike consolidations within both lungs, largest located within the anterior-medial aspects of the left upper lobe and within the lower lobes bilaterally. The masslike consolidation within the left upper lobe measures 5.5 x 5 cm. Largest masslike consolidation within the left lower lobe measures 7.2 x 5.2 cm. Largest masslike consolidation within the right lower lobe measures 4 x 3.3 cm. These could represent new pulmonary metastases or dense rounded pneumonias. Favor pneumonias. 2. Numerous metastatic pulmonary nodules throughout both lungs, many of which are slightly increased in size compared to the previous chest CT of 09/05/2015. Representative measurements given above. 3. No pulmonary embolism seen. 4. New small left pleural effusion. 5. Heart size is normal.  Ct Biopsy 09/25/2015 CT-guided core biopsies of the right paraspinal mass.   Code Status: DNR DVT prophylaxis: SCDs Family Communication: Husband and son bedside Disposition Plan: home possibly by 4/4  Faye Ramsay, MD  [facility] Pager [phone]  If 7PM-7AM, please contact night-coverage [email or web address] Password [email or web address]   09/28/2015, 11:35 AM  LOS: 4 days    HPI/Subjective: Overall feels better today. Hemoptysis this AM.   Objective: Filed Vitals:   09/27/15 1408 09/27/15 2004 09/27/15 2220 09/28/15 0603  BP: 142/53  126/78 145/58  Pulse: 54  64 81  Temp: 97.8 F (36.6 C)  98 F (36.7 C) 98.5 F (36.9 C)  TempSrc: Oral  Oral Oral  Resp: 19  16 17   Height:  5'  5" (1.651 m)    Weight:  66.1 kg (145 lb 11.6 oz)    SpO2: 95%  97% 92%    Exam:    General:  Appears comfortable, calm. Cardiovascular: Irregular, tachycardic, no murmur rub or gallop. Telemetry: Atrial fibrillation, HR in 60's this AM Respiratory: rales at  bases, noted hemoptysis, no tachypnea  Psychiatric: grossly normal mood and affect, speech fluent and appropriate   Scheduled Meds: . acidophilus  1 capsule Oral Daily  . antiseptic oral rinse  7 mL Mouth Rinse q12n4p  . aspirin EC  325 mg Oral Daily  . ceFEPime (MAXIPIME) IV  2 g Intravenous Q24H  . chlorhexidine  15 mL Mouth Rinse BID  . dexamethasone  4 mg Oral 4 times per day  . potassium chloride  40 mEq Oral Once  . propranolol  40 mg Oral Daily  . simvastatin  20 mg Oral QHS   Time spent 50 minutes

## 2015-09-28 NOTE — Consult Note (Signed)
PULMONARY / CRITICAL CARE MEDICINE   Name: Susan Clark MRN: [id number] DOB: Oct 02, 1928    ADMISSION DATE:  09/03/2015 CONSULTATION DATE: 09/28/15 pm   REFERRING MD:  [facility]  CHIEF COMPLAINT:  Hemoptysis  HISTORY OF PRESENT ILLNESS:   23 yowf never smoker with h/o melanoma admit with L CP with abn CT Chest c/w mets underwent CTbx paraspinal mass 09/25/15 with path pending and rx as pna but in meantime started coughing up BRB am 09/28/15 and CCM [facility]. Total amt of blood since onset was perhaps a half a cup at most/ no assoc epistaxis/last dose ASA 325 mg 4/1 am    History of Present Illness  82 y.o. female, With history of hypertension, coronary artery disease, hyperlipidemia, presents with complaints of generalized weakness, weight loss, and fatigue over last few weeks pta, with significant worsening of her coordination over last 24 hours pta, husband and son-in-law give history, report patient lost 20 pounds over last 2 months, been frail and weak, and they noticed fine monitor for coordination worsening over last 24 hours pta in ER patient was afebrile, workup significant for leukocytosis of WBC 24K, husband report this was find by PCP during last visit, was trying to arrange for hematology follow-up, CT head significant for left-sided vasogenic edema, with effacement of sulci, with 7 mm midline shift, ED discussed with neurosurgery, who recommended Decadron and MRI brain, as well as a discussed with neurology Dr. Nicole Kindred   Rx as Garen Grams / hcap with maxepime since 3/28 with no fever or purulent sputum  No obvious day to day or daytime variability or assoc   chest tightness, subjective wheeze or overt sinus or hb symptoms. No unusual exp hx or h/o childhood pna/ asthma or knowledge of premature birth.  Sleeping ok without nocturnal  or early am exacerbation  of respiratory  c/o's or need for noct saba. Also denies any obvious fluctuation of symptoms with  weather or environmental changes or other aggravating or alleviating factors except as outlined above   Current Medications, Allergies, Complete Past Medical History, Past Surgical History, Family History, and Social History were reviewed in [employer] record.  ROS  The following are not active complaints unless bolded sore throat, dysphagia, dental problems, itching, sneezing,  nasal congestion or excess/ purulent secretions, ear ache,   fever, chills, sweats, unintended wt loss,   exertional cp, hemoptysis,  orthopnea pnd or leg swelling, presyncope, palpitations, abdominal pain, anorexia, nausea, vomiting, diarrhea  or change in bowel or bladder habits, change in stools or urine, dysuria,hematuria,  rash, arthralgias, visual complaints, headache, numbness, weakness or ataxia or problems with walking or coordination,  change in mood/affect or memory.      PAST MEDICAL HISTORY :  She  has a past medical history of Palpitations; Chronic back pain; Hypertension; Anginal pain ([place]); Ischemic heart disease; and Metastatic cancer to brain [facility]).  PAST SURGICAL HISTORY: She  has past surgical history that includes Tonsillectomy and adenoidectomy; Partial hysterectomy (1971); Anterior cervical discectomy (03/28/2006); Cardiovascular stress test (09/13/2007); Coronary angioplasty with stent (2004); and Total knee arthroplasty (Bilateral).  Allergies  Allergen Reactions  . Buprenorphine Hcl Other (See Comments)    Make her see things that are not there and makes her nausea   . Morphine And Related     Make her see things that are not there and makes her nausea   . Prednisone Other (See Comments)    Made her hyper and could  not sleep     No current facility-administered medications on file prior to encounter.   Current Outpatient Prescriptions on File Prior to Encounter  Medication Sig  . amLODipine (NORVASC) 5 MG tablet Take 1 tablet (5 mg total) by mouth daily.  Marland Kitchen aspirin  EC 325 MG tablet Take 1 tablet (325 mg total) by mouth daily.  Marland Kitchen dexamethasone (DECADRON) 4 MG tablet Take 1 tablet (4 mg total) by mouth every 6 (six) hours.  . nitroGLYCERIN (NITROSTAT) 0.4 MG SL tablet Place 1 tablet (0.4 mg total) under the tongue every 5 (five) minutes as needed for chest pain.  . Probiotic Product (PROBIOTIC DAILY PO) Take 1 tablet by mouth daily.   . propranolol (INDERAL) 40 MG tablet Take 1 tablet (40 mg total) by mouth 2 (two) times daily.  . simvastatin (ZOCOR) 20 MG tablet Take 1 tablet (20 mg total) by mouth at bedtime.  Marland Kitchen zolpidem (AMBIEN) 10 MG tablet Take 1 tablet (10 mg total) by mouth at bedtime as needed for sleep.    FAMILY HISTORY:  Her indicated that her mother is deceased. She indicated that her father is deceased. She indicated that four of her five sisters are alive. She indicated that both of her brothers are deceased. She indicated that her maternal grandmother is deceased. She indicated that her maternal grandfather is deceased. She indicated that her paternal grandmother is deceased. She indicated that her paternal grandfather is deceased.   SOCIAL HISTORY: She  reports that she has never smoked. She has never used smokeless tobacco. She reports that she does not drink alcohol or use illicit drugs.     SUBJECTIVE:  No complaints at present - last blood maybe a tsp bright red one hour prior to exam s assoc sob    VITAL SIGNS: BP 145/58 mmHg  Pulse 81  Temp(Src) 98.5 F (36.9 C) (Oral)  Resp 17  Ht _0  (1.651 m)  Wt 145 lb 11.6 oz (66.1 kg)  BMI 24.25 kg/m2  SpO2 92%  RA  HEMODYNAMICS:    VENTILATOR SETTINGS:    INTAKE / OUTPUT: I/O last 3 completed shifts: In: 770 [P.O.:720; IV Piggyback:50] Out: -   PHYSICAL EXAMINATION: General:  Elderly wf nad eating soup @ 45degrees HOB  Wt Readings from Last 3 Encounters:  09/27/15 145 lb 11.6 oz (66.1 kg)  09/19/15 139 lb 4.8 oz (63.186 kg)  09/04/15 137 lb 12.6 oz (62.5 kg)    Vital  signs reviewed   HEENT: nl dentition, turbinates, and oropharynx. Nl external ear canals without cough reflex   NECK :  without JVD/Nodes/TM/ nl carotid upstrokes bilaterally   LUNGS: no acc muscle use,  Nl contour chest / a few crackles L > R base/ no localized wheezes or rhonchi  CV:  RRR  no s3 or murmur or increase in P2, no edema   ABD:  soft and nontender with nl inspiratory excursion in the supine position. No bruits or organomegaly, bowel sounds nl  MS:  Nl gait/ ext warm without deformities, calf tenderness, cyanosis or clubbing No obvious joint restrictions   SKIN: warm and dry without lesions    NEURO:  alert, approp, nl sensorium with  no motor deficits       LABS:  BMET  Recent Labs Lab 09/26/15 0617 09/27/15 0619 09/28/15 0721  NA 123* 124* 122*  K 3.3* 2.9* 2.9*  CL 89* 86* 83*  CO2 _1 BUN _2 CREATININE 0.35* 0.44  0.35*  GLUCOSE 104* 111* 103*    Electrolytes  Recent Labs Lab 09/26/15 0617 09/27/15 0619 09/28/15 0721  CALCIUM 7.8* 7.9* 7.9*    CBC  Recent Labs Lab 09/26/15 0617 09/27/15 0619 09/28/15 0721  WBC 34.3* 33.4* 29.7*  HGB 14.0 14.5 14.9  HCT 40.4 41.3 42.8  PLT 124* 110* 95*    Coag's  Recent Labs Lab 09/24/15 0833  APTT 26  INR 1.27    Sepsis Markers No results for input(s): LATICACIDVEN, PROCALCITON, O2SATVEN in the last 168 hours.  ABG No results for input(s): PHART, PCO2ART, PO2ART in the last 168 hours.  Liver Enzymes  Recent Labs Lab 09/22/2015 2355  AST 18  ALT 18  ALKPHOS 53  BILITOT 1.3*  ALBUMIN 2.4*    Cardiac Enzymes No results for input(s): TROPONINI, PROBNP in the last 168 hours.  Glucose No results for input(s): GLUCAP in the last 168 hours.  Imaging Ct Angio Chest Pe W/cm &/or Wo Cm  09/28/2015  CLINICAL DATA:  Metastatic malignancy. Dyspnea, cough, pneumonia, left-sided chest pain. Known brain metastases. Weakness and thrombocytopenia. EXAM: CT ANGIOGRAPHY CHEST  WITH CONTRAST TECHNIQUE: Multidetector CT imaging of the chest was performed using the standard protocol during bolus administration of intravenous contrast. Multiplanar CT image reconstructions and MIPs were obtained to evaluate the vascular anatomy. CONTRAST:  100 cc Isovue 370 COMPARISON:  None. FINDINGS: There is no pulmonary embolism identified within the main, lobar or segmental pulmonary arteries bilaterally. Scattered atherosclerotic changes are seen along the walls of the normal-caliber thoracic aorta. Heart size is normal. No pericardial effusion. Borderline enlarged precarinal lymph node again seen measuring 14 mm short axis dimension. No new masses or enlarged lymph nodes within the mediastinum. There is a new large dense masslike consolidation within the anterior-medial aspects of the left upper lobe, abutting the left mediastinal border, measuring 5.5 x 5 cm. Additional new masslike consolidations are seen at each lung base, largest within the left lower lobe measuring 7.2 x 5.2 cm, largest within the right lower lobe measuring 4 x 3.3 cm. There has been slight interval enlargement of numerous other pulmonary nodules within each lung. Representative nodule within the right lower lobe anteriorly (series 5, image 92) now measures 1.9 x 1.6 cm, previously 1.6 x 1.3 cm. Additional representative mass within the inferior right middle lobe now measures 2.8 x 1.7 cm (series 5, image 100), previously 2.6 x 1.3 cm. Nodule within the lateral aspects of the lingula measures 1.5 x 1.4 cm (series 5, image 73), previously 1.4 x 1.1 cm. There is a new small left pleural effusion. Trachea and central bronchi are unremarkable. No definite osseous metastases about the chest. Review of the MIP images confirms the above findings. IMPRESSION: 1. New dense masslike consolidations within both lungs, largest located within the anterior-medial aspects of the left upper lobe and within the lower lobes bilaterally. The masslike  consolidation within the left upper lobe measures 5.5 x 5 cm. Largest masslike consolidation within the left lower lobe measures 7.2 x 5.2 cm. Largest masslike consolidation within the right lower lobe measures 4 x 3.3 cm. These could represent new pulmonary metastases or dense rounded pneumonias. Favor pneumonias. 2. Numerous metastatic pulmonary nodules throughout both lungs, many of which are slightly increased in size compared to the previous chest CT of 09/05/2015. Representative measurements given above. 3. No pulmonary embolism seen. 4. New small left pleural effusion. 5. Heart size is normal.  No pericardial effusion. Electronically Signed   By: Cherlynn Kaiser  Enriqueta Shutter M.D.   On: 09/28/2015 11:22       ASSESSMENT / PLAN:   1) Hemoptysis secondary to met melanoma bilaterally aggravated by relatively low plts and ASA with no convincing evidence of pna or any other immediately reversible source  Rec:  D/c asa (done) - no role for plt transfusion in this setting   Unfortunately there is a limit to what we have to offer here other than a palliative approach from a pulmonary perspective as not a candidate for fob or IR embolization based on the multifocality of the problem and note she is appropriately dnr status.   Will reassure fm that a palliative approach is best here.   Christinia Gully, MD Pulmonary and [place] [phone] After 5:30 PM or weekends, call [phone]

## 2015-09-29 ENCOUNTER — Ambulatory Visit: Payer: PPO | Admitting: Oncology

## 2015-09-29 DIAGNOSIS — R0789 Other chest pain: Secondary | ICD-10-CM

## 2015-09-29 DIAGNOSIS — R918 Other nonspecific abnormal finding of lung field: Secondary | ICD-10-CM

## 2015-09-29 DIAGNOSIS — C801 Malignant (primary) neoplasm, unspecified: Secondary | ICD-10-CM

## 2015-09-29 LAB — CBC
HEMATOCRIT: 42.2 % (ref 36.0–46.0)
HEMOGLOBIN: 14.5 g/dL (ref 12.0–15.0)
MCH: 24.9 pg — AB (ref 26.0–34.0)
MCHC: 34.4 g/dL (ref 30.0–36.0)
MCV: 72.5 fL — AB (ref 78.0–100.0)
Platelets: 66 10*3/uL — ABNORMAL LOW (ref 150–400)
RBC: 5.82 MIL/uL — AB (ref 3.87–5.11)
RDW: 15.5 % (ref 11.5–15.5)
WBC: 26.5 10*3/uL — AB (ref 4.0–10.5)

## 2015-09-29 LAB — BASIC METABOLIC PANEL
ANION GAP: 10 (ref 5–15)
BUN: 9 mg/dL (ref 6–20)
CHLORIDE: 84 mmol/L — AB (ref 101–111)
CO2: 29 mmol/L (ref 22–32)
Calcium: 7.9 mg/dL — ABNORMAL LOW (ref 8.9–10.3)
Creatinine, Ser: 0.34 mg/dL — ABNORMAL LOW (ref 0.44–1.00)
GFR calc Af Amer: >60 mL/min (ref >60)
GLUCOSE: 102 mg/dL — AB (ref 65–99)
POTASSIUM: 3.6 mmol/L (ref 3.5–5.1)
Sodium: 123 mmol/L — ABNORMAL LOW (ref 135–145)

## 2015-09-29 LAB — CULTURE, BLOOD (ROUTINE X 2)
CULTURE: NO GROWTH
Culture: NO GROWTH

## 2015-09-29 MED ORDER — ACETAMINOPHEN 160 MG/5ML PO SOLN: 650.0000 mg | Freq: Four times a day (QID) | ORAL | Status: DC | PRN

## 2015-09-29 MED ORDER — HYDROCODONE-ACETAMINOPHEN 5-325 MG PO TABS
1.0000 | ORAL_TABLET | Freq: Four times a day (QID) | ORAL | Status: DC | PRN
  Administered 2015-09-29: 1 via ORAL
  Filled 2015-09-29: qty 1

## 2015-09-29 MED ORDER — TRAMADOL HCL 50 MG PO TABS: 50.0000 mg | ORAL_TABLET | Freq: Four times a day (QID) | ORAL | Status: DC | PRN

## 2015-09-29 NOTE — Progress Notes (Signed)
IP PROGRESS NOTE  Subjective:   Patient is known to me with history of melanoma and have presented with brain metastasis, paraspinal mass and lung nodules. Patient hospitalized last week for dyspnea on exertion, chest pain and lethargy. CT scan of the chest showed bilateral pulmonary nodules which have worsened in the last few days. She'll also noted episodic hemoptysis as well. She underwent biopsy of her paraspinal mass on 09/25/2015 and tolerated it well.  Clinically, she have been declining rather slowly. She is more lethargic and her by mouth intake has been poor. She is sleeping more and more at this time although she is arousable. She appears comfortable at this time without any respiratory distress.    Objective:  Vital signs in last 24 hours: Temp:  [97.9 F (36.6 C)-98.2 F (36.8 C)] 97.9 F (36.6 C) (04/03 0629) Pulse Rate:  [61-79] 79 (04/03 0629) Resp:  [16-19] 19 (04/03 0629) BP: (152-156)/(65-83) 152/83 mmHg (04/03 0629) SpO2:  [91 %-97 %] 97 % (04/03 0629) Weight change:  Last BM Date: 09/27/15  Intake/Output from previous day: 04/02 0701 - 04/03 0700 In: 730 [P.O.:680; IV Piggyback:50] Out: 100 [Urine:100] Awake but lethargic without distress. Mouth:  Dry mucous membranes noted. No thrush noted. Resp: clear to auscultation bilaterally Cardio: regular rate and rhythm, S1, S2 normal, no murmur, click, rub or gallop GI: soft, non-tender; bowel sounds normal; no masses,  no organomegaly Extremities: extremities normal, atraumatic, no cyanosis or edema    Lab Results:  Recent Labs  09/28/15 0721 09/29/15 0605  WBC 29.7* 26.5*  HGB 14.9 14.5  HCT 42.8 42.2  PLT 95* 66*    BMET  Recent Labs  09/28/15 0721 09/29/15 0605  NA 122* 123*  K 2.9* 3.6  CL 83* 84*  CO2 31 29  GLUCOSE 103* 102*  BUN 6 9  CREATININE 0.35* 0.34*  CALCIUM 7.9* 7.9*    Current Facility-Administered Medications  Medication Dose Route Frequency Provider Last Rate Last Dose   . acetaminophen (TYLENOL) tablet 650 mg  650 mg Oral Q6H PRN Edwin Dada, MD   650 mg at 09/28/15 2128   Or  . acetaminophen (TYLENOL) suppository 650 mg  650 mg Rectal Q6H PRN Edwin Dada, MD      . acidophilus (RISAQUAD) capsule 1 capsule  1 capsule Oral Daily Skeet Simmer, RPH   1 capsule at 09/29/15 1002  . albuterol (PROVENTIL) (2.5 MG/3ML) 0.083% nebulizer solution 2.5 mg  2.5 mg Nebulization Q4H PRN Dianne Dun, NP      . antiseptic oral rinse (CPC / CETYLPYRIDINIUM CHLORIDE 0.05%) solution 7 mL  7 mL Mouth Rinse q12n4p Theodis Blaze, MD   7 mL at 09/28/15 1723  . ceFEPIme (MAXIPIME) 2 g in dextrose 5 % 50 mL IVPB  2 g Intravenous Q24H Samuella Cota, MD   2 g at 09/29/15 1002  . chlorhexidine (PERIDEX) 0.12 % solution 15 mL  15 mL Mouth Rinse BID Theodis Blaze, MD   15 mL at 09/28/15 2148  . dexamethasone (DECADRON) tablet 4 mg  4 mg Oral 4 times per day Edwin Dada, MD   4 mg at 09/29/15 5638  . gi cocktail (Maalox,Lidocaine,Donnatal)  30 mL Oral TID PRN Theodis Blaze, MD      . magic mouthwash w/lidocaine  15 mL Oral QID PRN Theodis Blaze, MD   15 mL at 09/29/15 0854  . nitroGLYCERIN (NITROSTAT) SL tablet 0.4 mg  0.4 mg Sublingual  Q5 min PRN Theodis Blaze, MD      . ondansetron [facility]) tablet 4 mg  4 mg Oral Q6H PRN Edwin Dada, MD       Or  . ondansetron (ZOFRAN) injection 4 mg  4 mg Intravenous Q6H PRN Edwin Dada, MD      . propranolol (INDERAL) tablet 40 mg  40 mg Oral Daily Theodis Blaze, MD   40 mg at 09/29/15 1002  . simvastatin (ZOCOR) tablet 20 mg  20 mg Oral QHS Samuella Cota, MD   20 mg at 09/28/15 2128  . zolpidem (AMBIEN) tablet 5 mg  5 mg Oral QHS PRN Theodis Blaze, MD   5 mg at 09/28/15 2128     Studies/Results: Ct Angio Chest Pe W/cm &/or Wo Cm  09/28/2015  CLINICAL DATA:  Metastatic malignancy. Dyspnea, cough, pneumonia, left-sided chest pain. Known brain metastases. Weakness and  thrombocytopenia. EXAM: CT ANGIOGRAPHY CHEST WITH CONTRAST TECHNIQUE: Multidetector CT imaging of the chest was performed using the standard protocol during bolus administration of intravenous contrast. Multiplanar CT image reconstructions and MIPs were obtained to evaluate the vascular anatomy. CONTRAST:  100 cc Isovue 370 COMPARISON:  None. FINDINGS: There is no pulmonary embolism identified within the main, lobar or segmental pulmonary arteries bilaterally. Scattered atherosclerotic changes are seen along the walls of the normal-caliber thoracic aorta. Heart size is normal. No pericardial effusion. Borderline enlarged precarinal lymph node again seen measuring 14 mm short axis dimension. No new masses or enlarged lymph nodes within the mediastinum. There is a new large dense masslike consolidation within the anterior-medial aspects of the left upper lobe, abutting the left mediastinal border, measuring 5.5 x 5 cm. Additional new masslike consolidations are seen at each lung base, largest within the left lower lobe measuring 7.2 x 5.2 cm, largest within the right lower lobe measuring 4 x 3.3 cm. There has been slight interval enlargement of numerous other pulmonary nodules within each lung. Representative nodule within the right lower lobe anteriorly (series 5, image 92) now measures 1.9 x 1.6 cm, previously 1.6 x 1.3 cm. Additional representative mass within the inferior right middle lobe now measures 2.8 x 1.7 cm (series 5, image 100), previously 2.6 x 1.3 cm. Nodule within the lateral aspects of the lingula measures 1.5 x 1.4 cm (series 5, image 73), previously 1.4 x 1.1 cm. There is a new small left pleural effusion. Trachea and central bronchi are unremarkable. No definite osseous metastases about the chest. Review of the MIP images confirms the above findings. IMPRESSION: 1. New dense masslike consolidations within both lungs, largest located within the anterior-medial aspects of the left upper lobe and  within the lower lobes bilaterally. The masslike consolidation within the left upper lobe measures 5.5 x 5 cm. Largest masslike consolidation within the left lower lobe measures 7.2 x 5.2 cm. Largest masslike consolidation within the right lower lobe measures 4 x 3.3 cm. These could represent new pulmonary metastases or dense rounded pneumonias. Favor pneumonias. 2. Numerous metastatic pulmonary nodules throughout both lungs, many of which are slightly increased in size compared to the previous chest CT of 09/05/2015. Representative measurements given above. 3. No pulmonary embolism seen. 4. New small left pleural effusion. 5. Heart size is normal.  No pericardial effusion. Electronically Signed   By: Franki Cabot M.D.   On: 09/28/2015 11:22    Medications: I have reviewed the patient's current medications.  Assessment/Plan:  82 year old woman with the following issues:  1.  Metastatic malignancy highly suspicious of melanoma. She has documented brain metastasis in addition to a paraspinal mass as well as pulmonary nodules. She is status post biopsy on 09/25/2015 and the results are currently pending.  I discussed these findings with the patient and her family today extensively. Regardless of the etiology of her malignancy her clinical status have deteriorated rapidly and I do not think she'll be a candidate for treatment regardless of the histology. Even if it is BRAF mutated melanoma I do not think she is a candidate for any therapy.  I have recommended supportive care only and hospice enrollment. She is approaching end-stage status and comfort care is very appropriate at this time. She understands she has limited life expectancy living forward.  2. Thrombocytopenia: Unclear etiology likely related to malignancy and possibly a recent infection. She does have hemoptysis but I do not support platelet transfusion at this time.  3. Brain metastasis: She is currently on dexamethasone which could be  tapered by 4 mg every week. I would recommend changing dexamethasone to 4 mg 3 times a day for the next week, then 4 mg twice a day for a week and then 4 mg daily for the following week. Dexamethasone can be discontinued after that.  4. Disposition and prognosis: Her prognosis is very poor limited life expectancy. I have no objections to discharging her home with hospice care if that's the patient and her family's wishes.   LOS: 5 days   FBXUXY,BFXOV 09/29/2015, 10:10 AM

## 2015-09-29 NOTE — Progress Notes (Signed)
PT Cancellation Note  Patient Details Name: Susan Clark MRN: [id number] DOB: 05-10-1929   Cancelled Treatment:    Reason Eval/Treat Not Completed: Fatigue/lethargy limiting ability to participate.  Pt reporting significant fatigue today.  She was agreeable for PT to continue to check on her acutely to see what, if any mobility we can help her with while she is here in the hospital.  I do believe the plan is to go home with hospice.    Thanks,   Barbarann Ehlers. [place], [place], DPT [phone]   09/29/2015, 3:59 PM

## 2015-09-29 NOTE — Progress Notes (Signed)
PROGRESS NOTE  ALDA GAUTREAUX [id number] DOB: 1929-05-11 DOA: 09/20/2015 PCP: Stephens Shire, MD  Summary: 82 year old woman with metastatic disease of unclear primary at this point, presented with left-sided chest pain, cough and weakness. Chest x-ray suggested developing pneumonia and she was admitted for further treatment.  Major event since admission:  4/2 - sudden onset mid area chest discomfort and hemoptysis, PCCM consulted   Antibiotics:  Cefepime 3/29 >> 4/3   Vancomycin 3/29 >> 4.3  Assessment/Plan: 1. LLL PNA, treated as HCAP given recent hospitalization early March 2017  - started on vancomycin and cefepime for pneumonia, unknown pathogen - today is day #7 of Maxipime but vancomycin was stopped on 09/25/2015 - was initially doing well but this AM developed hemoptysis  - CT chest findings below, PCCM consulted, now decision made to transition to comfort care   2. Brain, lung, paraspinal metastasis:  - suspected melanoma versus unknown primary, was diagnosed with melanoma in 2011 and biopsy of the neck mass at that time c/w basal cell ca - apparently she was doing well for some time until early march developed weakness and was noted to have brain mets - Continue dexamethasone, ondansetron - appreciate Dr. Alen Blew assistance   3. 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 Leukocytosis:  - Dexamethasone superimposed on suspected PNA.  5. Thrombocytopenia  - in the setting of malignancy - SCD's for DVT prophylaxis   6. Hypokalemia - supplemented and WNL   7. Protein calorie malnutrition - ? Severe, consulted dietician   8. Paroxysmal a-fib, CHADS2 score 3-4 - on propranolol for rate control - not candidate for [facility] - focus on comfort    Diagnostic studies:  09/24/2015 CXR Diffuse airspace haziness in the left lung most concerning for developing pneumonia. Multiple bilateral  pulmonary nodules compatible with metastatic disease  Ct Angio Chest Pe W/cm &/or Wo Cm 09/28/2015 New dense masslike consolidations within both lungs, largest located within the anterior-medial aspects of the left upper lobe and within the lower lobes bilaterally. The masslike consolidation within the left upper lobe measures 5.5 x 5 cm. Largest masslike consolidation within the left lower lobe measures 7.2 x 5.2 cm. Largest masslike consolidation within the right lower lobe measures 4 x 3.3 cm. These could represent new pulmonary metastases or dense rounded pneumonias. Favor pneumonias. 2. Numerous metastatic pulmonary nodules throughout both lungs, many of which are slightly increased in size compared to the previous chest CT of 09/05/2015. Representative measurements given above. 3. No pulmonary embolism seen. 4. New small left pleural effusion. 5. Heart size is normal.  Ct Biopsy 09/25/2015 CT-guided core biopsies of the right paraspinal mass.   Code Status: DNR DVT prophylaxis: SCDs Family Communication: Husband and family at bedside  Disposition Plan: home possibly by 4/5 with hospice   Faye Ramsay, MD  [facility] Pager [phone]  If 7PM-7AM, please contact night-coverage [email or web address] Password [email or web address]   09/29/2015, 3:00 PM  LOS: 5 days    HPI/Subjective: Overall feels better today. Hemoptysis this AM.   Objective: Filed Vitals:   09/28/15 0603 09/28/15 1448 09/28/15 2119 09/29/15 0629  BP: 145/58 153/65 156/83 152/83  Pulse: 81 61 62 79  Temp: 98.5 F (36.9 C) 98.2 F (36.8 C)  97.9 F (36.6 C)  TempSrc: Oral Oral  Oral  Resp: 17 19 16 19   Height:      Weight:      SpO2: 92% 91% 91%  97%    Exam:    General:  Appears comfortable, calm. Cardiovascular: Irregular, tachycardic, no murmur rub or gallop. Telemetry: Atrial fibrillation, HR in 60's this AM Respiratory: rales at bases, noted hemoptysis, no tachypnea  Psychiatric: grossly normal mood and affect,  speech fluent and appropriate   Scheduled Meds: . acidophilus  1 capsule Oral Daily  . antiseptic oral rinse  7 mL Mouth Rinse q12n4p  . ceFEPime (MAXIPIME) IV  2 g Intravenous Q24H  . chlorhexidine  15 mL Mouth Rinse BID  . dexamethasone  4 mg Oral 4 times per day  . propranolol  40 mg Oral Daily  . simvastatin  20 mg Oral QHS   Time spent 50 minutes

## 2015-09-29 NOTE — Consult Note (Signed)
   [facility] CM Inpatient Consult   09/29/2015  Susan Clark Apr 17, 1929 [id number] Consult received.  Came up to see the patient and the inpatient RNCM, states, this patient is being seen for Hospice referral at this point.  Will follow up after the Hospice assessment has been made.  Reviewed chart encounter and currently patient is being offered Hospice and Palliative care.  [facility] will follow up as appropriate.  Thanks for the consult but at this time, patient will receive full care management from Hospice, if the patient and family chooses.  For questions, please contact: Natividad Brood, RN BSN [facility] Liaison  [phone] business mobile phone Toll free office [phone]

## 2015-09-30 MED ORDER — ONDANSETRON 8 MG PO TBDP
8.0000 mg | ORAL_TABLET | ORAL | Status: DC | PRN
  Filled 2015-09-30: qty 1

## 2015-09-30 MED ORDER — MORPHINE SULFATE (CONCENTRATE) 10 MG/0.5ML PO SOLN
10.0000 mg | ORAL | Status: DC | PRN
  Administered 2015-10-01: 10 mg via ORAL
  Filled 2015-09-30: qty 0.5

## 2015-09-30 MED ORDER — HYDROMORPHONE HCL 1 MG/ML IJ SOLN
1.0000 mg | INTRAMUSCULAR | Status: DC | PRN
  Administered 2015-10-01: 1 mg via INTRAVENOUS
  Filled 2015-09-30: qty 1

## 2015-09-30 NOTE — Progress Notes (Signed)
The biopsy results discussed with the reviewing pathologist which confirmed to me metastatic melanoma.

## 2015-09-30 NOTE — Progress Notes (Signed)
PROGRESS NOTE  Susan Clark [id number] DOB: 12-07-1928 DOA: 09/26/2015 PCP: Stephens Shire, MD  Summary: 82 year old woman with metastatic disease of unclear primary at this point, presented with left-sided chest pain, cough and weakness. Chest x-ray suggested developing pneumonia and she was admitted for further treatment.  Major event since admission:  4/2 - sudden onset mid area chest discomfort and hemoptysis, PCCM consulted   4/4 - unable to swallow this AM, more hemoptysis and more lethargic  Antibiotics:  Cefepime 3/29 >> 4/3   Vancomycin 3/29 >> 4.3  Assessment/Plan: 1. LLL PNA, treated as HCAP given recent hospitalization early March 2017  - started on vancomycin and cefepime for pneumonia, unknown pathogen - completed #7 days of Maxipime but vancomycin was stopped on 09/25/2015 - was initially doing well but developed hemoptysis  - CT chest findings below, PCCM consulted, now decision made to transition to comfort care  - pt with rather fast deterioration in clinical status, worse this AM, unsafe for any transport at this time   2. Brain, lung, paraspinal metastasis:  - suspected melanoma versus unknown primary, was diagnosed with melanoma in 2011 and biopsy of the neck mass at that time c/w basal cell ca - apparently she was doing well for some time until early march developed weakness and was noted to have brain mets - biopsy confirms metastatic melanoma  - appreciate Dr. Alen Blew assistance  - stop PO meds as pt unable to swallow   3. 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 Leukocytosis:  - Dexamethasone superimposed on suspected PNA. - no further blood work   5. Thrombocytopenia  - in the setting of malignancy - SCD's for DVT prophylaxis   6. Hypokalemia - supplemented  7. Protein calorie malnutrition - Severe, consulted dietician but currently unable to take anything  PO  8. Paroxysmal a-fib, CHADS2 score 3-4 - on propranolol for rate control - not candidate for [facility] - focus on comfort    Diagnostic studies:  09/24/2015 CXR Diffuse airspace haziness in the left lung most concerning for developing pneumonia. Multiple bilateral pulmonary nodules compatible with metastatic disease  Ct Angio Chest Pe W/cm &/or Wo Cm 09/28/2015 New dense masslike consolidations within both lungs, largest located within the anterior-medial aspects of the left upper lobe and within the lower lobes bilaterally. The masslike consolidation within the left upper lobe measures 5.5 x 5 cm. Largest masslike consolidation within the left lower lobe measures 7.2 x 5.2 cm. Largest masslike consolidation within the right lower lobe measures 4 x 3.3 cm. These could represent new pulmonary metastases or dense rounded pneumonias. Favor pneumonias. 2. Numerous metastatic pulmonary nodules throughout both lungs, many of which are slightly increased in size compared to the previous chest CT of 09/05/2015. Representative measurements given above. 3. No pulmonary embolism seen. 4. New small left pleural effusion. 5. Heart size is normal.  Ct Biopsy 09/25/2015 CT-guided core biopsies of the right paraspinal mass.   Code Status: DNR DVT prophylaxis: SCDs Family Communication: Husband and family at bedside  Disposition Plan: worse this AM, no plans for d/c at this time   Faye Ramsay, MD  [facility] Pager [phone]  If 7PM-7AM, please contact night-coverage [email or web address] Password [email or web address]   09/30/2015, 5:13 PM  LOS: 6 days    HPI/Subjective: Lethargic this AM, unable to swallow.   Objective: Filed Vitals:   09/29/15 2200 09/30/15 0515 09/30/15 1356 09/30/15 1559  BP: 150/63  162/53 108/79 139/68  Pulse: 120 52 60 81  Temp: 97.6 F (36.4 C) 98.7 F (37.1 C) 98.1 F (36.7 C) 99.3 F (37.4 C)  TempSrc: Oral Oral Oral Oral  Resp: 16 16 18 18   Height:      Weight:      SpO2:  94% 96% 94% 96%    Exam:    General:  Appears lethargic Cardiovascular: Irregular, tachycardic, no murmur rub or gallop. Telemetry: Atrial fibrillation, HR in 80's this AM Respiratory: rales at bases, noted hemoptysis, no tachypnea    Scheduled Meds: . antiseptic oral rinse  7 mL Mouth Rinse q12n4p  . chlorhexidine  15 mL Mouth Rinse BID   Time spent 50 minutes

## 2015-09-30 NOTE — Care Management Note (Signed)
Case Management Note  Patient Details  Name: Susan Clark MRN: [id number] Date of Birth: [birth year]  Subjective/Objective:    Admitted with hemoptysis from home with husband. Pt with hx of brain CA/mets.                Action/Plan: Plan to d/c to home with hospice on 10/01/2015.  Expected Discharge Date:    10/01/2015           Expected Discharge Plan:  Home w Hospice Care  In-House Referral:     Discharge planning Services  CM Consult  Post Acute Care Choice:    Choice offered to:  Patient/family  DME Arranged:    DME Agency:     HH Arranged:    [place] Agency:   ([facility])/ [place], [phone]  Status of Service:  In process, will continue to follow  If discussed at Long Length of Stay Meetings, dates discussed:  09/30/2015  Additional Comments:  [name], [place] [phone] 09/30/2015, 11:48 AM

## 2015-09-30 NOTE — Progress Notes (Signed)
Pt too lethargic and spitting medications back out. Provider Doyle Askew) made aware. No further orders given at this time.

## 2015-09-30 NOTE — Consult Note (Signed)
   [facility] CM Inpatient Consult   09/30/2015  Susan Clark 04-05-1929 [id number] Updated note: Inpatient RNCM states the Patient and family have decided on home with Hospice care. Given this choice, the patient will have full case management services through Hospice and [place] Management will not pursue for care management services at this point.  [facility] Care Management appreciate the consult.  For questions, please contact: Natividad Brood, RN BSN [place] [facility] Liaison  [phone] business mobile phone Toll free office [phone]

## 2015-09-30 NOTE — Progress Notes (Signed)
No charge note.  [place] Team has placed an order for Case Management to offer choice for Hospice at home.  Hospice services should be able to meet the needs for home equipment, RN, and aide services.  PMT is happy to offer assistance if needed.  Please call us back if we can help.  Thanks,  Imogene Burn, [place] [facility] Pager: [phone]

## 2015-09-30 NOTE — Progress Notes (Signed)
   09/30/15 1139  PT Visit Information  Last PT Received On 09/30/15  Assistance Needed +1  Reason Eval/Treat Not Completed Other (comment) (Pt sleeping, family refused for PTA to wake patient.  Family reports sitting pt up in chair and state, "she can't tolerate it today.")  History of Present Illness 82 y.o. female with a past medical history significant for CAD and metastatic melanoma (diagnosed 08/2015) who presents with chest pain and cough and weakness. She is admitted for HCAP.  Governor Rooks, PTA pager [phone]

## 2015-09-30 NOTE — Care Management Important Message (Signed)
Important Message  Patient Details  Name: Susan Clark MRN: [id number] Date of Birth: 1928/10/31   Medicare Important Message Given:  Yes    Nadim Malia [name] 09/30/2015, 11:07 AM

## 2015-10-01 MED ORDER — SODIUM CHLORIDE 0.9% FLUSH
3.0000 mL | Freq: Two times a day (BID) | INTRAVENOUS | Status: DC
  Administered 2015-10-01 (×2): 3 mL via INTRAVENOUS

## 2015-10-01 MED ORDER — SCOPOLAMINE 1 MG/3DAYS TD PT72
1.0000 | MEDICATED_PATCH | TRANSDERMAL | Status: DC
  Administered 2015-10-01: 1.5 mg via TRANSDERMAL
  Filled 2015-10-01: qty 1

## 2015-10-01 MED ORDER — SODIUM CHLORIDE 0.9% FLUSH: 3.0000 mL | INTRAVENOUS | Status: DC | PRN

## 2015-10-01 MED ORDER — SODIUM CHLORIDE 0.9 % IV SOLN: 250.0000 mL | INTRAVENOUS | Status: DC | PRN

## 2015-10-01 MED ORDER — GLYCOPYRROLATE 0.2 MG/ML IJ SOLN
0.2000 mg | INTRAMUSCULAR | Status: DC | PRN
  Filled 2015-10-01: qty 1

## 2015-10-01 MED ORDER — LORAZEPAM 1 MG PO TABS: 1.0000 mg | ORAL_TABLET | ORAL | Status: DC | PRN

## 2015-10-01 MED ORDER — LORAZEPAM 2 MG/ML IJ SOLN: 1.0000 mg | INTRAMUSCULAR | Status: DC | PRN

## 2015-10-01 MED ORDER — HYDROMORPHONE HCL 1 MG/ML IJ SOLN
0.5000 mg | INTRAMUSCULAR | Status: DC | PRN
  Administered 2015-10-01: 0.5 mg via INTRAVENOUS
  Filled 2015-10-01: qty 1

## 2015-10-01 MED ORDER — FLUCONAZOLE IN SODIUM CHLORIDE 100-0.9 MG/50ML-% IV SOLN
100.0000 mg | INTRAVENOUS | Status: DC
  Administered 2015-10-01: 100 mg via INTRAVENOUS
  Filled 2015-10-01 (×2): qty 50

## 2015-10-01 MED ORDER — LORAZEPAM 2 MG/ML PO CONC: 1.0000 mg | ORAL | Status: DC | PRN

## 2015-10-01 MED ORDER — GLYCOPYRROLATE 1 MG PO TABS
1.0000 mg | ORAL_TABLET | ORAL | Status: DC | PRN
  Filled 2015-10-01: qty 1

## 2015-10-01 NOTE — Progress Notes (Signed)
PROGRESS NOTE  Susan Clark [id number] DOB: 21-Mar-1929 DOA: 08/27/2015 PCP: Stephens Shire, MD  Summary: 82 year old woman with metastatic disease of unclear primary at this point, presented with left-sided chest pain, cough and weakness. Chest x-ray suggested developing pneumonia and she was admitted for further treatment.  Major event since admission:  4/2 - sudden onset mid area chest discomfort and hemoptysis, PCCM consulted   4/4 - unable to swallow this AM, more hemoptysis and more lethargic  Antibiotics:  Cefepime 3/29 >> 4/3   Vancomycin 3/29 >> 4.3  Assessment/Plan: 1. LLL PNA, treated as HCAP given recent hospitalization early March 2017  - started on vancomycin and cefepime for pneumonia, unknown pathogen - completed #7 days of Maxipime but vancomycin was stopped on 09/25/2015 - was initially doing well but developed hemoptysis  - CT chest findings below, PCCM consulted, now decision made to transition to comfort care  - pt with rather fast deterioration in clinical status, worse this AM, unsafe for any transport at this time  - hospice on board   2. Brain, lung, paraspinal metastasis:  - suspected melanoma versus unknown primary, was diagnosed with melanoma in 2011 and biopsy of the neck mass at that time c/w basal cell ca - apparently she was doing well for some time until early march developed weakness and was noted to have brain mets - biopsy confirms metastatic melanoma  - appreciate Dr. Alen Blew assistance  - full comfort   3. 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 Leukocytosis:  - Dexamethasone superimposed on suspected PNA. - no further blood work   5. Thrombocytopenia  - in the setting of malignancy  6. Hypokalemia - supplemented, no further blood work to ensure comfort   7. Protein calorie malnutrition - Severe, consulted dietician but currently unable to take  anything PO  8. Paroxysmal a-fib, CHADS2 score 3-4 - on propranolol for rate control - not candidate for [facility] - focus on comfort    Diagnostic studies:  09/24/2015 CXR Diffuse airspace haziness in the left lung most concerning for developing pneumonia. Multiple bilateral pulmonary nodules compatible with metastatic disease  Ct Angio Chest Pe W/cm &/or Wo Cm 09/28/2015 New dense masslike consolidations within both lungs, largest located within the anterior-medial aspects of the left upper lobe and within the lower lobes bilaterally. The masslike consolidation within the left upper lobe measures 5.5 x 5 cm. Largest masslike consolidation within the left lower lobe measures 7.2 x 5.2 cm. Largest masslike consolidation within the right lower lobe measures 4 x 3.3 cm. These could represent new pulmonary metastases or dense rounded pneumonias. Favor pneumonias. 2. Numerous metastatic pulmonary nodules throughout both lungs, many of which are slightly increased in size compared to the previous chest CT of 09/05/2015. Representative measurements given above. 3. No pulmonary embolism seen. 4. New small left pleural effusion. 5. Heart size is normal.  Ct Biopsy 09/25/2015 CT-guided core biopsies of the right paraspinal mass.   Code Status: DNR DVT prophylaxis: SCDs Family Communication: Husband and family at bedside  Disposition Plan: worse this AM, no plans for d/c at this time   Faye Ramsay, MD  [facility] Pager [phone]  If 7PM-7AM, please contact night-coverage [email or web address] Password [email or web address]   10/01/2015, 1:02 PM  LOS: 7 days    HPI/Subjective: Lethargic this AM  Objective: Filed Vitals:   09/30/15 1356 09/30/15 1559 09/30/15 2230 10/01/15 0513  BP: 108/79 139/68 122/67 113/80  Pulse: 60 81 59 88  Temp: 98.1 F (36.7 C) 99.3 F (37.4 C) 97.7 F (36.5 C) 98.1 F (36.7 C)  TempSrc: Oral Oral Oral Oral  Resp: 18 18 16 16   Height:      Weight:      SpO2: 94% 96% 94% 92%     Exam:    General:  Appears lethargic, agonal breaths  Cardiovascular: Irregular, tachycardic Telemetry: Atrial fibrillation, HR in 80's this AM Respiratory: rales at bases   Scheduled Meds: . antiseptic oral rinse  7 mL Mouth Rinse q12n4p  . chlorhexidine  15 mL Mouth Rinse BID  . fluconazole (DIFLUCAN) IV  100 mg Intravenous Q24H  . scopolamine  1 patch Transdermal Q72H  . sodium chloride flush  3 mL Intravenous Q12H   Time spent 50 minutes

## 2015-10-01 NOTE — Progress Notes (Signed)
Nutrition Brief Note  Chart reviewed. Pt now transitioning to comfort care.  No further nutrition interventions warranted at this time.  Please re-consult as needed.   Kaylyne Axton A. Moranda Billiot, RD, LDN, CDE Pager: [phone] After hours Pager: [phone]  

## 2015-10-01 NOTE — Progress Notes (Signed)
UR COMPLETED  

## 2015-10-01 NOTE — Progress Notes (Signed)
PMT RN Note: pt has had impressive decline since yesterday, and Dr Doyle Askew anticipates hospital death. PMT expects prognosis of minutes to hours due to rapid decline and new unresponsiveness. Discussed with Dr Hilma Favors and implemented orders per palliative care protocol. Plan f/u by PMT member tomorrow if pt is still alive. Family has contact information for PMT. Larina Earthly, RN, BSN, [facility] 10/01/2015 12:03 PM Cell [phone] 8:00-4:00 Monday-Friday Office [phone]

## 2015-10-08 ENCOUNTER — Encounter: Payer: Self-pay | Admitting: *Deleted

## 2015-10-13 ENCOUNTER — Ambulatory Visit: Payer: Self-pay | Admitting: Radiation Oncology

## 2015-10-27 ENCOUNTER — Ambulatory Visit: Payer: Self-pay | Admitting: Radiation Oncology

## 2015-10-27 NOTE — Progress Notes (Signed)
Pt expired at 0209. Death was expected and pt was comfort care. Death certificate completed and given to unit secretary.

## 2015-10-27 NOTE — Discharge Summary (Signed)
  Death Summary  Susan Clark [id number] DOB: 1929/05/04 DOA: 01-Oct-2015  PCP: Stephens Shire, MD PCP/Office notified:   Admit date: October 01, 2015 Date of Death: 10-Oct-2015 at 02:09  Final Diagnoses:  Principal Problem:   Healthcare-associated pneumonia Active Problems:   Metastatic melanoma of brain ([place])   Hyponatremia   Leukocytosis   Atrial fibrillation (HCC)   Hemoptysis   Chest pain, atypical  Summary: 82 year old woman with metastatic disease of unclear primary at this point, presented with left-sided chest pain, cough and weakness. Chest x-ray suggested developing pneumonia and she was admitted for further treatment.  Major event since admission:  4/2 - sudden onset mid area chest discomfort and hemoptysis, PCCM consulted   4/4 - unable to swallow this AM, more hemoptysis and more lethargic  Antibiotics:  Cefepime 3/29 >> 4/3   Vancomycin 3/29 >> 4.3  Assessment/Plan: 1. LLL PNA, treated as HCAP given recent hospitalization early March 2017  - started on vancomycin and cefepime for pneumonia, unknown pathogen - completed #7 days of Maxipime but vancomycin was stopped on 09/25/2015 - was initially doing well but developed hemoptysis  - CT chest findings below, PCCM consulted, decision made to transition to comfort care    2. Brain, lung, paraspinal metastasis:  - suspected melanoma versus unknown primary, was diagnosed with melanoma in 2011 and biopsy of the neck mass at that time c/w basal cell ca - apparently she was doing well for some time until early march developed weakness and was noted to have brain mets - biopsy confirms metastatic melanoma  - full comfort   3. Hyponatremia:  - Possibly SIADH given brain metastasis  4. Leukocytosis:  - Dexamethasone superimposed on suspected PNA.  5. Thrombocytopenia  - in the setting of malignancy  6. Hypokalemia - supplemented but no further blood work done to ensure comfort   7. Protein calorie  malnutrition - Severe, in the setting of malignancy  8. Paroxysmal a-fib, CHADS2 score 3-4 - focus on comfort   Diagnostic studies:  09/24/2015 CXR Diffuse airspace haziness in the left lung most concerning for developing pneumonia. Multiple bilateral pulmonary nodules compatible with metastatic disease  Ct Angio Chest Pe W/cm &/or Wo Cm 09/28/2015 New dense masslike consolidations within both lungs, largest located within the anterior-medial aspects of the left upper lobe and within the lower lobes bilaterally. The masslike consolidation within the left upper lobe measures 5.5 x 5 cm. Largest masslike consolidation within the left lower lobe measures 7.2 x 5.2 cm. Largest masslike consolidation within the right lower lobe measures 4 x 3.3 cm. These could represent new pulmonary metastases or dense rounded pneumonias. Favor pneumonias. 2. Numerous metastatic pulmonary nodules throughout both lungs, many of which are slightly increased in size compared to the previous chest CT of 09/05/2015. Representative measurements given above. 3. No pulmonary embolism seen. 4. New small left pleural effusion. 5. Heart size is normal.  Ct Biopsy 09/25/2015 CT-guided core biopsies of the right paraspinal mass.   Code Status: DNR DVT prophylaxis: SCDs Family Communication: Husband and family at bedside   Faye Ramsay, MD [facility] Pager [phone]  Time:   Signed:  Faye Ramsay  [facility] 10/08/2015, 11:03 PM  [phone]

## 2015-10-27 DEATH — deceased

## 2015-11-13 ENCOUNTER — Ambulatory Visit: Payer: PPO | Admitting: Cardiovascular Disease

## 2018-03-05 IMAGING — CT CT BIOPSY
1 of 4 series · 14 of 32 positions shown, 19 images · non-contrast
Comparison: none

INDICATION: 86-year-old female with evidence of metastatic disease. Patient has
intracranial lesions, chest lesions and a right paraspinal mass.
Tissue diagnosis is needed. Prior history of melanoma.

[Series 2: i-spiral 5.0 b40f · axial · 0.83mm/px · z∈[+1083,+1248]mm · 14 of 53 slices shown, 19 images]
[im 3/53  soft-tissue]
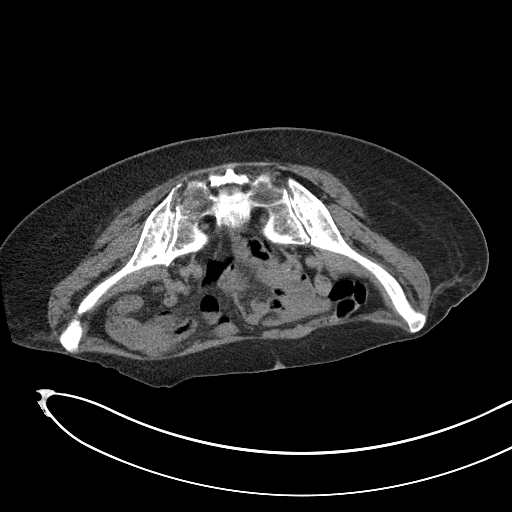
[im 3/53  bone]
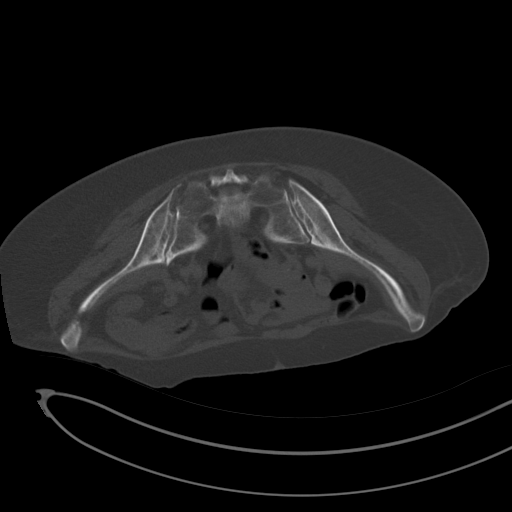
[im 6/53  soft-tissue]
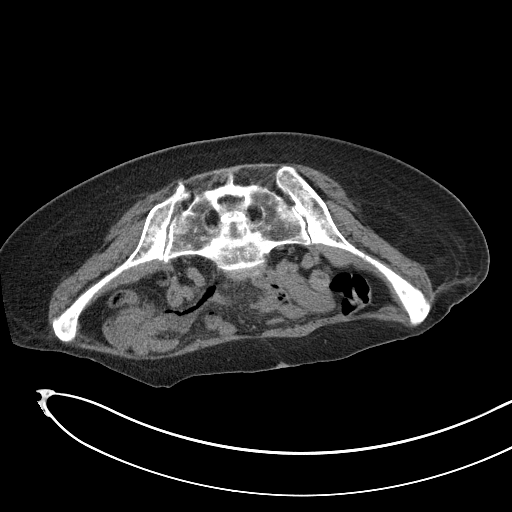
[im 12/53  soft-tissue]
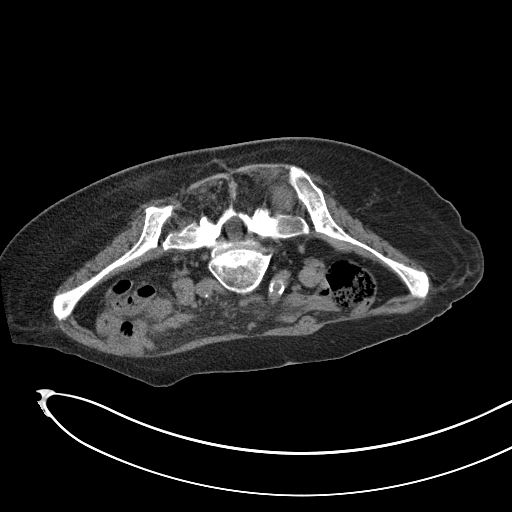
[im 15/53  soft-tissue]
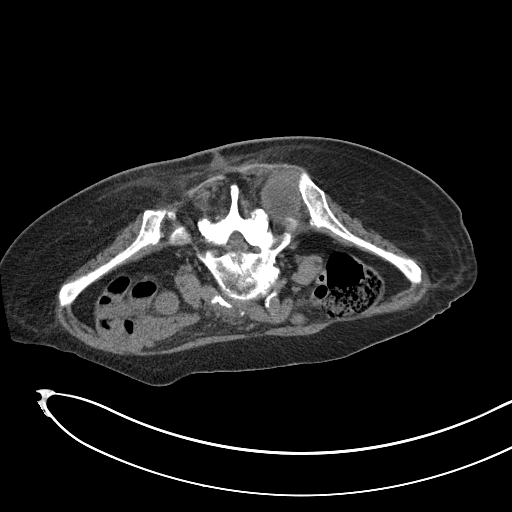
[im 18/53  soft-tissue]
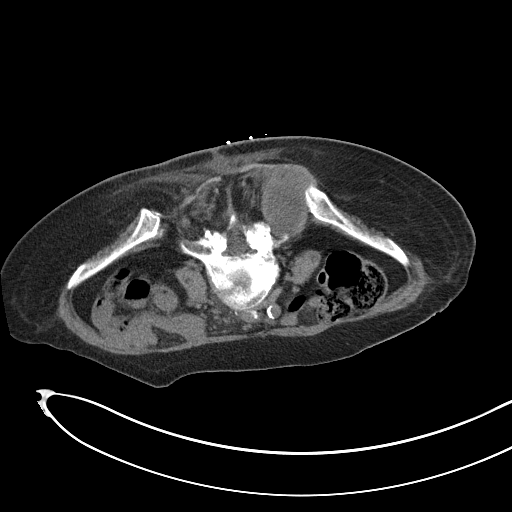
[im 24/53  soft-tissue]
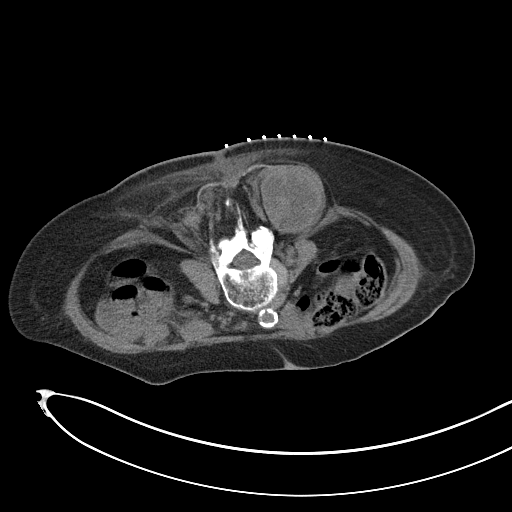
[im 27/53  soft-tissue]
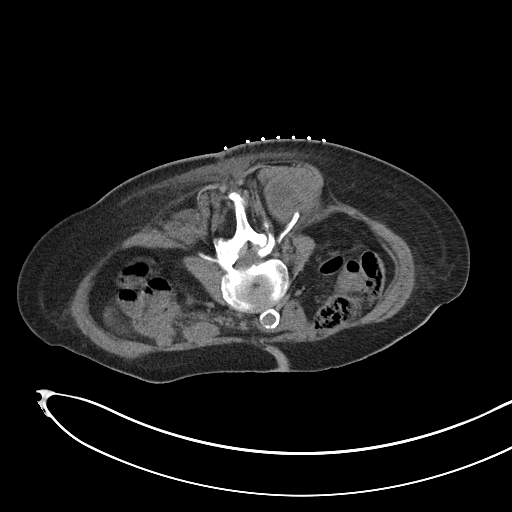
[im 29/53  soft-tissue]
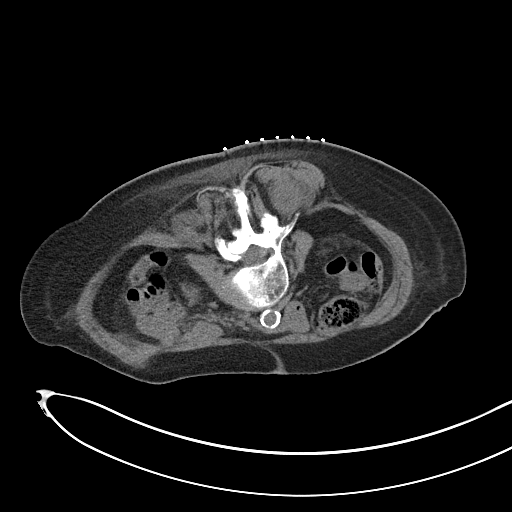
[im 35/53  soft-tissue]
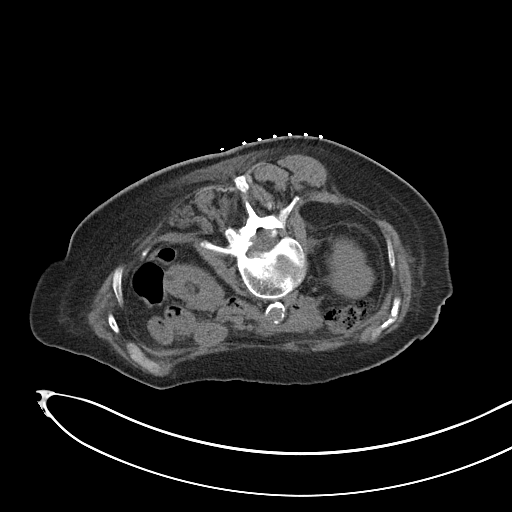
[im 35/53  bone]
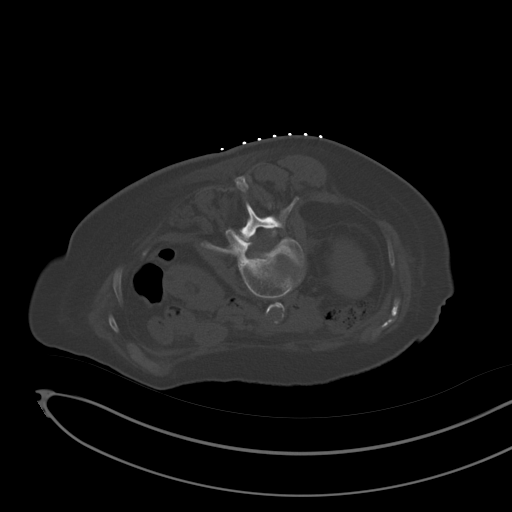
[im 38/53  soft-tissue]
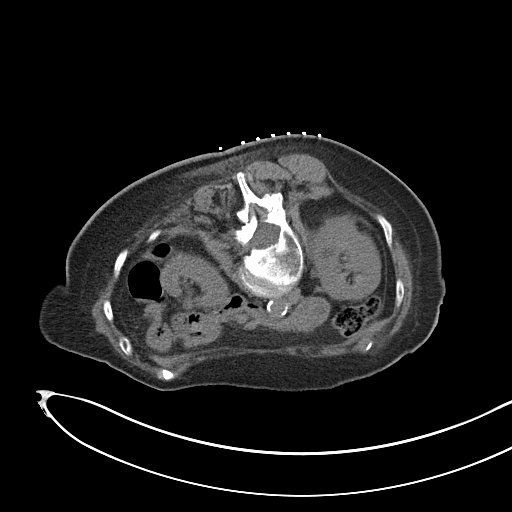
[im 41/53  soft-tissue]
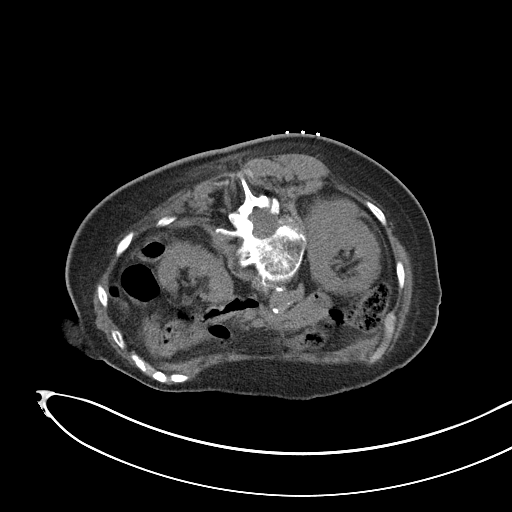
[im 41/53  lung]
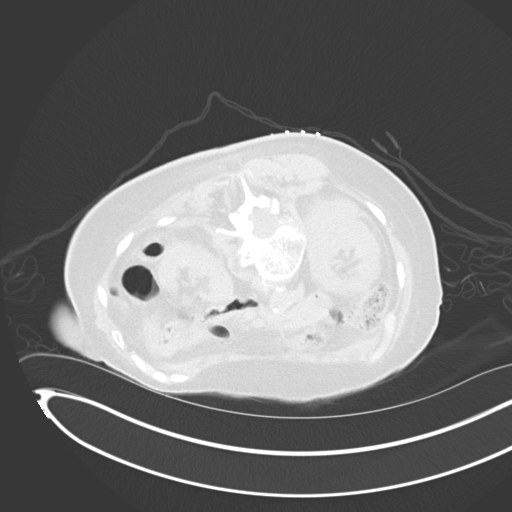
[im 44/53  lung]
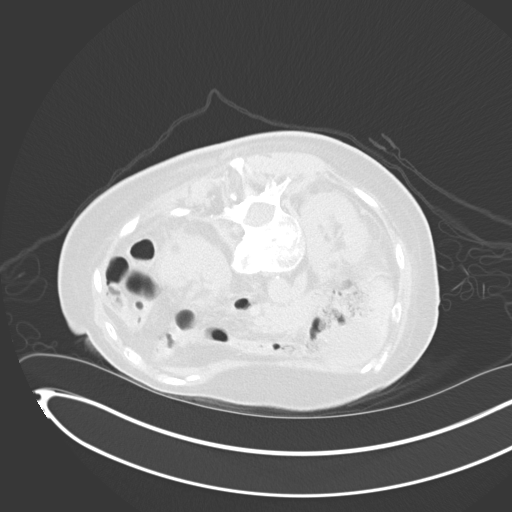
[im 47/53  soft-tissue]
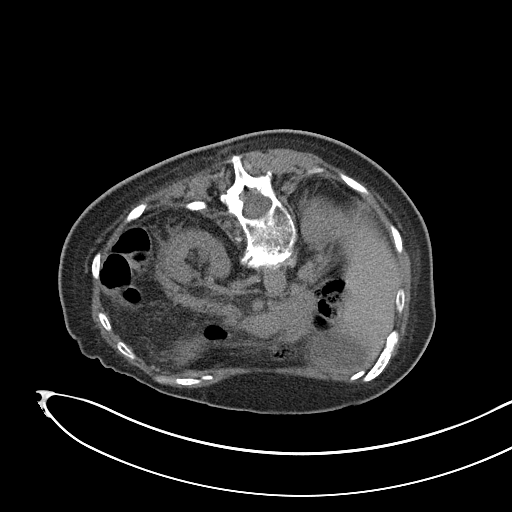
[im 47/53  lung]
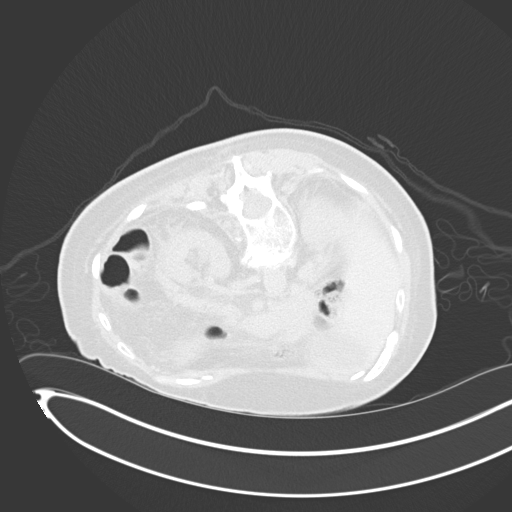
[im 50/53  soft-tissue]
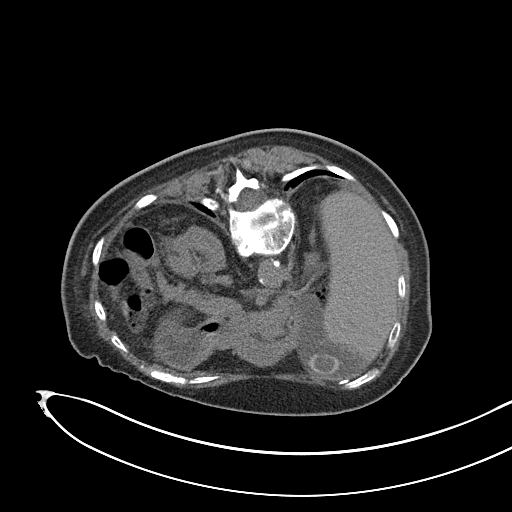
[im 50/53  lung]
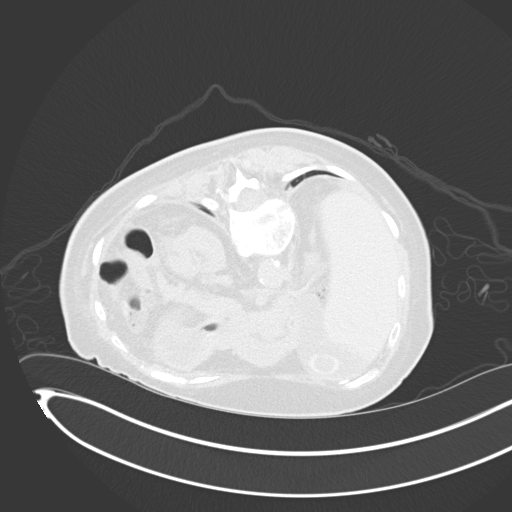

[14 of 32 positions shown; findings below may reference images not displayed]

EXAM:
CT BIOPSY OF RIGHT PARASPINAL MASS

MEDICATIONS:
None.

ANESTHESIA/SEDATION:
Moderate (conscious) sedation was employed during this procedure. A
total of Versed 0.5 mg and Fentanyl 12.5 mcg was administered
intravenously.

Moderate Sedation Time: 10 minutes. The patient's level of
consciousness and vital signs were monitored continuously by
radiology nursing throughout the procedure under my direct
supervision.

FLUOROSCOPY TIME:  None

COMPLICATIONS:
None immediate.

PROCEDURE:
Informed written consent was obtained from the patient after a
thorough discussion of the procedural risks, benefits and
alternatives. All questions were addressed. A timeout was performed
prior to the initiation of the procedure.

Patient was placed prone on the CT scanner. Images through the
abdomen were obtained. The large mass in the right paraspinal
musculature was targeted. Right side of back was prepped with
chlorhexidine and sterile field was created. The 17 gauge needle was
directed into the lesion with CT guidance. Four core biopsies were
obtained with an 18 gauge core device. Needle removed without
complication. Bandage placed over the puncture site.
FINDINGS: Again noted is a large heterogeneous lesion in the right paraspinal
musculature. This lesion is centered at the level of L4-L5. There
was brisk bleeding from the 17 gauge needle between the core
biopsies. No significant bleeding identified on the post biopsy
images.
IMPRESSION: CT-guided core biopsies of the right paraspinal mass.
